# Patient Record
Sex: Female | Born: 1965
Health system: Southern US, Community
[De-identification: ages and names within clinical notes are randomized; demographics above are authoritative.]

## PROBLEM LIST (undated history)

## (undated) DIAGNOSIS — E079 Disorder of thyroid, unspecified: Secondary | ICD-10-CM

## (undated) DIAGNOSIS — T7840XA Allergy, unspecified, initial encounter: Secondary | ICD-10-CM

## (undated) DIAGNOSIS — I10 Essential (primary) hypertension: Secondary | ICD-10-CM

## (undated) HISTORY — DX: Essential (primary) hypertension: I10

## (undated) HISTORY — DX: Allergy, unspecified, initial encounter: T78.40XA

## (undated) HISTORY — PX: OTHER SURGICAL HISTORY: SHX169

---

## 1998-09-02 ENCOUNTER — Other Ambulatory Visit: Admission: RE | Admit: 1998-09-02 | Discharge: 1998-09-02 | Payer: Self-pay | Admitting: Obstetrics and Gynecology

## 1999-04-01 ENCOUNTER — Inpatient Hospital Stay (HOSPITAL_COMMUNITY): Admission: AD | Admit: 1999-04-01 | Discharge: 1999-04-01 | Payer: Self-pay | Admitting: Obstetrics and Gynecology

## 1999-09-11 ENCOUNTER — Other Ambulatory Visit: Admission: RE | Admit: 1999-09-11 | Discharge: 1999-09-11 | Payer: Self-pay | Admitting: Obstetrics and Gynecology

## 2000-05-13 ENCOUNTER — Other Ambulatory Visit: Admission: RE | Admit: 2000-05-13 | Discharge: 2000-05-13 | Payer: Self-pay | Admitting: Obstetrics and Gynecology

## 2000-08-05 ENCOUNTER — Encounter: Admission: RE | Admit: 2000-08-05 | Discharge: 2000-08-05 | Payer: Self-pay | Admitting: Family Medicine

## 2000-08-05 ENCOUNTER — Encounter: Payer: Self-pay | Admitting: Family Medicine

## 2001-09-09 ENCOUNTER — Other Ambulatory Visit: Admission: RE | Admit: 2001-09-09 | Discharge: 2001-09-09 | Payer: Self-pay | Admitting: Obstetrics and Gynecology

## 2002-01-15 ENCOUNTER — Emergency Department (HOSPITAL_COMMUNITY): Admission: EM | Admit: 2002-01-15 | Discharge: 2002-01-16 | Payer: Self-pay | Admitting: Emergency Medicine

## 2002-01-16 ENCOUNTER — Encounter: Payer: Self-pay | Admitting: Emergency Medicine

## 2002-09-10 ENCOUNTER — Other Ambulatory Visit: Admission: RE | Admit: 2002-09-10 | Discharge: 2002-09-10 | Payer: Self-pay | Admitting: Obstetrics and Gynecology

## 2003-02-16 ENCOUNTER — Encounter: Payer: Self-pay | Admitting: Obstetrics and Gynecology

## 2003-02-16 ENCOUNTER — Ambulatory Visit (HOSPITAL_COMMUNITY): Admission: RE | Admit: 2003-02-16 | Discharge: 2003-02-16 | Payer: Self-pay | Admitting: Obstetrics and Gynecology

## 2003-03-30 ENCOUNTER — Encounter: Admission: RE | Admit: 2003-03-30 | Discharge: 2003-03-30 | Payer: Self-pay | Admitting: Family Medicine

## 2003-09-10 ENCOUNTER — Other Ambulatory Visit: Admission: RE | Admit: 2003-09-10 | Discharge: 2003-09-10 | Payer: Self-pay | Admitting: Obstetrics and Gynecology

## 2004-07-27 ENCOUNTER — Encounter: Admission: RE | Admit: 2004-07-27 | Discharge: 2004-07-27 | Payer: Self-pay | Admitting: Family Medicine

## 2005-05-31 ENCOUNTER — Other Ambulatory Visit: Admission: RE | Admit: 2005-05-31 | Discharge: 2005-05-31 | Payer: Self-pay | Admitting: Obstetrics and Gynecology

## 2005-10-05 ENCOUNTER — Inpatient Hospital Stay (HOSPITAL_COMMUNITY): Admission: AD | Admit: 2005-10-05 | Discharge: 2005-10-05 | Payer: Self-pay | Admitting: Obstetrics and Gynecology

## 2007-01-17 ENCOUNTER — Emergency Department (HOSPITAL_COMMUNITY): Admission: EM | Admit: 2007-01-17 | Discharge: 2007-01-17 | Payer: Self-pay | Admitting: Emergency Medicine

## 2007-08-29 ENCOUNTER — Encounter: Admission: RE | Admit: 2007-08-29 | Discharge: 2007-08-29 | Payer: Self-pay | Admitting: Family Medicine

## 2007-09-02 ENCOUNTER — Encounter: Admission: RE | Admit: 2007-09-02 | Discharge: 2007-09-02 | Payer: Self-pay | Admitting: Family Medicine

## 2007-10-21 ENCOUNTER — Encounter: Admission: RE | Admit: 2007-10-21 | Discharge: 2007-10-21 | Payer: Self-pay | Admitting: Obstetrics and Gynecology

## 2007-11-11 ENCOUNTER — Encounter (INDEPENDENT_AMBULATORY_CARE_PROVIDER_SITE_OTHER): Payer: Self-pay | Admitting: Obstetrics and Gynecology

## 2007-11-11 ENCOUNTER — Ambulatory Visit (HOSPITAL_COMMUNITY): Admission: RE | Admit: 2007-11-11 | Discharge: 2007-11-11 | Payer: Self-pay | Admitting: Obstetrics and Gynecology

## 2009-05-05 ENCOUNTER — Other Ambulatory Visit: Admission: RE | Admit: 2009-05-05 | Discharge: 2009-05-05 | Payer: Self-pay | Admitting: Radiology

## 2010-06-17 ENCOUNTER — Encounter: Payer: Self-pay | Admitting: Family Medicine

## 2010-06-18 ENCOUNTER — Encounter: Payer: Self-pay | Admitting: Family Medicine

## 2010-09-21 ENCOUNTER — Ambulatory Visit (HOSPITAL_COMMUNITY)
Admission: RE | Admit: 2010-09-21 | Discharge: 2010-09-21 | Disposition: A | Discharge status: Home / Self Care | Payer: Federal, State, Local not specified - PPO | Source: Ambulatory Visit | Attending: Chiropractic Medicine | Admitting: Chiropractic Medicine

## 2010-09-21 ENCOUNTER — Other Ambulatory Visit (HOSPITAL_COMMUNITY): Payer: Self-pay | Admitting: Chiropractic Medicine

## 2010-09-21 DIAGNOSIS — R52 Pain, unspecified: Secondary | ICD-10-CM

## 2010-09-21 DIAGNOSIS — M545 Low back pain, unspecified: Secondary | ICD-10-CM | POA: Insufficient documentation

## 2010-09-21 DIAGNOSIS — M47817 Spondylosis without myelopathy or radiculopathy, lumbosacral region: Secondary | ICD-10-CM | POA: Insufficient documentation

## 2010-10-10 NOTE — Op Note (Signed)
NAMEYEN, Holly Hays NO.:  [id number]   MEDICAL RECORD NO.:  [id number]          PATIENT TYPE:  AMB   LOCATION:  SDC                           FACILITY:  [facility]   PHYSICIAN:  Osborn Coho, M.D.   DATE OF BIRTH:  1965-08-29   DATE OF PROCEDURE:  11/11/2007  DATE OF DISCHARGE:                               OPERATIVE REPORT   PREOPERATIVE DIAGNOSIS:  Missed abortion.   POSTOPERATIVE DIAGNOSIS:  Missed abortion.   PROCEDURE:  Dilation and evacuation.   ATTENDING PHYSICIAN:  Osborn Coho, MD   ANESTHESIA:  General, via LMA.   SPECIMEN TO PATHOLOGY:  Products of conception.   FLUIDS:  1000 mL.   URINE OUTPUT:  Quantity sufficient; via straight cath prior to  procedure.   ESTIMATED BLOOD LOSS:  Minimal.   COMPLICATIONS:  None.   PROCEDURE:  The patient was taken to the operating room after the risks,  benefits, and alternatives discussed with the patient.  The patient  verbalized understanding and consent signed and witnessed.  The patient  was placed under general per anesthesia and prepped and draped in a  normal sterile fashion in the dorsal lithotomy position.  A bivalve  speculum was placed in the patient's vagina and a paracervical block  administered using a total of 10 mL of 1% lidocaine.  The anterior lip  of the cervix was grasped with single-tooth tenaculum and the uterus  sounded to approximately 10 cm.  The cervix was dilated for passage of  the size #8 suction curette.  Suction curettage was performed and a  sharp curettage was performed until a gritty texture was noted.  Sharp  curettage was performed once again to remove any remaining debris.  The  tenaculum was removed and there was good hemostasis at the tenaculum  site.  The bivalve speculum was removed as well.  Count was correct.  The patient tolerated the procedure well and is currently awaiting  transfer to the recovery room in good condition.      Osborn Coho, M.D.  Electronically Signed     AR/MEDQ  D:  11/11/2007  T:  11/12/2007  Job:  [id number]

## 2010-10-10 NOTE — H&P (Signed)
NAMESLOKA, VOLANTE NO.:  [id number]   MEDICAL RECORD NO.:  [id number]          PATIENT TYPE:  AMB   LOCATION:  SDC                           [facility]   PHYSICIAN:  Osborn Coho, M.D.   DATE OF BIRTH:  1965-11-04   DATE OF ADMISSION:  DATE OF DISCHARGE:                              HISTORY & PHYSICAL   The patient is scheduled to have a D&E on November 11, 2007.  Holly Hays is a  45 year old gravida 4, para 3-0-0-3 who presents at 11 weeks and 2 days  by LMP, but 8 weeks by fetal size with an identified IUFD on ultrasound  on November 10, 2007.  The patient has been seen in the office for her new  OB visit on November 03, 2007, at that time she was 10 weeks and 1 day by  LMP.  The fetal heart tones could not be auscultated at that time, so  she was scheduled for an ultrasound.  In the intervening time, she  presented to [facility] yesterday for an episode of bleeding.  At that time, she per her report was told everything was okay; however,  based upon review with the records provided by the patient, there was an  8-week IUFD noted with a quantitative hCG level of 3352.  The patient  then followed up today for the scheduled ultrasound, and was found to  have an 8-week fetal demise.   Options reviewed with the patient, and the decision on her part was to  proceed with D&E.   History has been remarkable for:  1. Advanced maternal age.  2. Chronic hypertension with the patient is on labetalol 200 mg p.o.      b.i.d.  3. Hypothyroid.  The patient is not currently on any medications.  4. History of postpartum hemorrhage.  5. Obesity.   PRENATAL LABS:  Blood type is O positive.  Rh antibody negative.  VDRL  nonreactive.  Rubella titer is equivocal.  Hepatitis B surface antigen  negative. HIV was nonreactive.  Sickle cell test was negative.  Pap was  normal in May 2009.  GC and chlamydia cultures were negative on November 03, 2007.  Urinalysis and culture were  negative on November 03, 2007.  The  patient's TSH was within normal limits.  Comprehensive metabolic panel  was also within normal limits.   HISTORY OF PRESENT PREGNANCY:  As previously stated.   OBSTETRICAL HISTORY:  In 1985, the patient had a vaginal birth of a  female infant, weight 7 pounds 12 ounces at 40 weeks.  She was in labor  24 hours.  She had no anesthesia, that child was named [facility] and was  born in [place].  In 1987, she had a vaginal birth of a female  infant, weight 6 pounds 12 ounces at 40 weeks' gestation.  She was in  labor 24 hours.  She had no anesthesia, that child's name was [place],  and also born in [place].  In 1992, she had a vaginal birth of a  female infant, weight 8 pounds 12 ounces  at 40 weeks.  She was in labor  36 hours.  She had no anesthesia.  That child's name was [place], that  child was also born in [place].  The patient did have a postpartum  hemorrhage with that birth and did receive a blood transfusion  subsequently.   MEDICAL HISTORY:  She was a previous Depo-Provera, oral contraceptive,  NuvaRing, and IUD user.  She discontinued use of any contraceptions in  October 2008.  She reports usual childhood illnesses.  She has  occasional yeast infections.  She does have chronic hypertension and has  been on labetalol.  She reported the previously noted blood transfusion  with a postpartum hemorrhage after her third delivery.  She has  hypothyroidism, but is not on any current medication.  She has  occasional urinary tract infections and migraines.  Her only  hospitalization is for childbirth x3.  She has no surgical history.   GENETIC HISTORY:  Remarkable for the patient's age of 78.   FAMILY HISTORY:  Remarkable for multiple family members having  hypertension.  Her sisters have thyroid disease.   ALLERGIES:  The patient has no known medication allergies.  She does  have some reported history of sensitivity to latex.   SOCIAL HISTORY:   The patient is married with the father of the baby.  He  is involved and supportive.  His name is Tamirah George.  The patient is an  [place] of the [place] faith.  She did 12 years of college.  She is employed in a Clinical biochemist firm.  Her husband is graduate  educated, and he is employed with [employer].  She has also  been followed by Dr. Hal Hope at [facility].   PHYSICAL EXAMINATION:  VITAL SIGNS:  Stable.  The patient is febrile.  HEENT:  Within normal limits.  LUNGS:  Breath sounds are clear.  HEART:  Regular rate and rhythm without murmur.  BREASTS:  Soft and nontender.  ABDOMEN:  Fundal height is approximately 6-8 weeks' size and nontender.  PELVIC:  Deferred today.  EXTREMITIES:  Deep tendon reflexes are 2+ without clonus.  There is a  trace edema noted.   Her blood type is O positive.   IMPRESSION:  1. Fetal demise at 8 weeks.  2. O positive blood type.  3. Desires dilatation and evacuation.  4. Chronic hypertension, on labetalol.  5. Hypothyroidism, on no current medications.   PLAN:  1. Admit to [facility] for preoperative same-day surgery with Dr.      Su Hilt.  2. Routine preoperative orders.  3. Support the patient for her loss.      Holly Hays, C.N.M.      Osborn Coho, M.D.  Electronically Signed    VLL/MEDQ  D:  11/10/2007  T:  11/11/2007  Job:  [id number]

## 2011-01-01 ENCOUNTER — Other Ambulatory Visit (HOSPITAL_COMMUNITY)
Admission: RE | Admit: 2011-01-01 | Discharge: 2011-01-01 | Disposition: A | Discharge status: Home / Self Care | Payer: Federal, State, Local not specified - PPO | Source: Ambulatory Visit | Attending: Obstetrics and Gynecology | Admitting: Obstetrics and Gynecology

## 2011-01-01 ENCOUNTER — Other Ambulatory Visit: Payer: Self-pay | Admitting: Obstetrics and Gynecology

## 2011-01-01 DIAGNOSIS — Z124 Encounter for screening for malignant neoplasm of cervix: Secondary | ICD-10-CM | POA: Insufficient documentation

## 2011-02-22 LAB — CBC
Hemoglobin: 12.3
RBC: 3.63 — ABNORMAL LOW

## 2011-02-22 LAB — ABO/RH: ABO/RH(D): O POS

## 2011-02-22 LAB — TSH: TSH: 0.421

## 2011-02-22 LAB — T3: T3, Total: 140.4 (ref 80.0–204.0)

## 2011-03-09 LAB — I-STAT 8, (EC8 V) (CONVERTED LAB)
BUN: 9
Bicarbonate: 22.8
Chloride: 108
Glucose, Bld: 104 — ABNORMAL HIGH
HCT: 41
pCO2, Ven: 47.6
pH, Ven: 7.287

## 2011-03-09 LAB — POCT CARDIAC MARKERS
CKMB, poc: <1 — ABNORMAL LOW
Myoglobin, poc: 47
Operator id: 234501
Troponin i, poc: <0.05

## 2011-05-07 ENCOUNTER — Ambulatory Visit (INDEPENDENT_AMBULATORY_CARE_PROVIDER_SITE_OTHER): Payer: Federal, State, Local not specified - PPO | Admitting: Family Medicine

## 2011-05-07 DIAGNOSIS — I10 Essential (primary) hypertension: Secondary | ICD-10-CM

## 2011-06-11 ENCOUNTER — Ambulatory Visit: Payer: Federal, State, Local not specified - PPO | Admitting: Family Medicine

## 2011-08-25 ENCOUNTER — Other Ambulatory Visit: Payer: Self-pay | Admitting: Family Medicine

## 2011-09-13 DIAGNOSIS — Z8742 Personal history of other diseases of the female genital tract: Secondary | ICD-10-CM | POA: Insufficient documentation

## 2011-10-14 ENCOUNTER — Ambulatory Visit (INDEPENDENT_AMBULATORY_CARE_PROVIDER_SITE_OTHER): Payer: Federal, State, Local not specified - PPO | Admitting: Family Medicine

## 2011-10-14 VITALS — BP 147/91 | HR 71 | Temp 98.1°F | Resp 16 | Ht 67.5 in | Wt 219.6 lb

## 2011-10-14 DIAGNOSIS — Z111 Encounter for screening for respiratory tuberculosis: Secondary | ICD-10-CM

## 2011-10-14 DIAGNOSIS — I10 Essential (primary) hypertension: Secondary | ICD-10-CM

## 2011-10-14 DIAGNOSIS — Z0289 Encounter for other administrative examinations: Secondary | ICD-10-CM

## 2011-10-14 DIAGNOSIS — E039 Hypothyroidism, unspecified: Secondary | ICD-10-CM

## 2011-10-14 DIAGNOSIS — R3129 Other microscopic hematuria: Secondary | ICD-10-CM

## 2011-10-14 LAB — TSH: TSH: 0.617 u[IU]/mL (ref 0.350–4.500)

## 2011-10-14 LAB — POCT CBC
Hemoglobin: 12.7 g/dL (ref 12.2–16.2)
Lymph, poc: 2 (ref 0.6–3.4)
MCH, POC: 33 pg — AB (ref 27–31.2)
MCHC: 34.1 g/dL (ref 31.8–35.4)
MID (cbc): 0.5 (ref 0–0.9)
MPV: 9 fL (ref 0–99.8)
POC Granulocyte: 3.8 (ref 2–6.9)
POC LYMPH PERCENT: 32 %L (ref 10–50)
POC MID %: 7.8 %M (ref 0–12)
RDW, POC: 12.5 %
WBC: 6.3 10*3/uL (ref 4.6–10.2)

## 2011-10-14 LAB — COMPREHENSIVE METABOLIC PANEL
ALT: 11 U/L (ref 0–35)
Alkaline Phosphatase: 78 U/L (ref 39–117)
CO2: 23 mEq/L (ref 19–32)
Creat: 0.8 mg/dL (ref 0.50–1.10)
Total Bilirubin: 0.4 mg/dL (ref 0.3–1.2)

## 2011-10-14 LAB — LIPID PANEL
Cholesterol: 175 mg/dL (ref 0–200)
HDL: 50 mg/dL (ref >39)
Total CHOL/HDL Ratio: 3.5 Ratio
VLDL: 11 mg/dL (ref 0–40)

## 2011-10-14 LAB — POCT URINALYSIS DIPSTICK
Ketones, UA: NEGATIVE
Protein, UA: NEGATIVE
Spec Grav, UA: 1.025
pH, UA: 5

## 2011-10-14 MED ORDER — LEVOTHYROXINE SODIUM 100 MCG PO TABS: 100.0000 ug | ORAL_TABLET | Freq: Every day | ORAL | Status: DC

## 2011-10-14 MED ORDER — LISINOPRIL-HYDROCHLOROTHIAZIDE 20-12.5 MG PO TABS: 1.0000 | ORAL_TABLET | Freq: Every day | ORAL | Status: DC

## 2011-10-14 MED ORDER — AMLODIPINE BESYLATE 5 MG PO TABS: 5.0000 mg | ORAL_TABLET | Freq: Every day | ORAL | Status: DC

## 2011-10-14 NOTE — Progress Notes (Signed)
Patient Name: Holly Hays Date of Birth: 03-Jun-1965 Medical Record Number: [id number] Gender: female Date of Encounter: 10/14/2011  History of Present Illness:  Holly Hays is a 46 y.o. 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  She had a fever blister which she noted about a week ago.    She treated it with abreva and blistex.  However, the area is still sore and feels dry.  She sometimes uses valtrex but did not use this time.    She recetntly had a breast reduction so her mammogram is not done yet for this year- however she plans to do this soon.  Her pap is up to date per her OBG   She had a piece of bread this am but that is  LMP at the beginning of the month.    There is no problem list on file for this patient.  No past medical history on file. No past surgical history on file. History  Substance Use Topics  . Smoking status: Never Smoker   . Smokeless tobacco: Not on file  . Alcohol Use: No   No family history on file. Allergies  Allergen Reactions  . Latex Itching and Rash    Medication list has been reviewed and updated.  Review of Systems: As per HPI- otherwise negative.   Physical Examination: Filed Vitals:   10/14/11 0835 10/14/11 0841  BP: 155/94 147/91  Pulse: 60 71  Temp: 98.1 F (36.7 C) 98.1 F (36.7 C)  TempSrc: Oral Oral  Resp: 16 16  Height: 5' 7.5" (1.715 m) 5' 7.5" (1.715 m)  Weight: 219 lb 9.6 oz (99.61 kg) 219 lb 9.6 oz (99.61 kg)  SpO2: 100% 100%    Body mass index is 33.89 kg/(m^2).  GEN: WDWN, NAD, Non-toxic, A & O x 3, obese HEENT: Atraumatic, Normocephalic. Neck supple. No masses, No LAD.  Tm and oropharynx wnl.  Lips appear dry but other wise no lesions Ears and Nose: No external deformity. CV: RRR, No M/G/R. No JVD. No thrill. No extra heart sounds. PULM: CTA B, no wheezes, crackles, rhonchi. No retractions. No resp. distress. No  accessory muscle use. ABD: S, NT, ND, +BS. No rebound. No HSM. EXTR: No c/c/e NEURO Normal gait.  PSYCH: Normally interactive. Conversant. Not depressed or anxious appearing.  Calm demeanor.   Results for orders placed in visit on 10/14/11  POCT CBC      Component Value Range   WBC 6.3  4.6 - 10.2 (K/uL)   Lymph, poc 2.0  0.6 - 3.4    POC LYMPH PERCENT 32.0  10 - 50 (%L)   MID (cbc) 0.5  0 - 0.9    POC MID % 7.8  0 - 12 (%M)   POC Granulocyte 3.8  2 - 6.9    Granulocyte percent 60.2  37 - 80 (%G)   RBC 3.85 (*) 4.04 - 5.48 (M/uL)   Hemoglobin 12.7  12.2 - 16.2 (g/dL)   HCT, POC 46.9 (*) 62.9 - 47.9 (%)   MCV 96.5  80 - 97 (fL)   MCH, POC 33.0 (*) 27 - 31.2 (pg)   MCHC 34.1  31.8 - 35.4 (g/dL)   RDW, POC 52.8     Platelet Count, POC 288  142 - 424 (K/uL)   MPV 9.0  0 - 99.8 (fL)  POCT URINALYSIS DIPSTICK  Component Value Range   Color, UA yellow     Clarity, UA clear     Glucose, UA neg     Bilirubin, UA neg     Ketones, UA neg     Spec Grav, UA 1.025     Blood, UA small     pH, UA 5.0     Protein, UA neg     Urobilinogen, UA 0.2     Nitrite, UA neg     Leukocytes, UA Negative      Assessment and Plan: 1. Physical exam for certificate  POCT CBC, POCT urinalysis dipstick, TB Skin Test  2. Hypertension  Comprehensive metabolic panel, Lipid panel  3. Hypothyroidism  TSH   Labs pending as above, asked her to check her BP a few times over the next month and let me know the readings- may need to increase her norvasc to 10mg / day.  Otherwise will follow- up with her TSH, etc.  I think her lips are dry from excessive use of OTC medications/ medicated balms.  Use plain vaseline for protection only

## 2011-10-16 ENCOUNTER — Encounter: Payer: Self-pay | Admitting: Family Medicine

## 2011-10-16 NOTE — Progress Notes (Signed)
Addended by: Abbe Amsterdam C on: 10/16/2011 10:42 AM   Modules accepted: Orders

## 2011-10-17 ENCOUNTER — Ambulatory Visit (INDEPENDENT_AMBULATORY_CARE_PROVIDER_SITE_OTHER): Payer: Federal, State, Local not specified - PPO

## 2011-10-17 DIAGNOSIS — Z111 Encounter for screening for respiratory tuberculosis: Secondary | ICD-10-CM

## 2011-11-05 ENCOUNTER — Ambulatory Visit (INDEPENDENT_AMBULATORY_CARE_PROVIDER_SITE_OTHER): Payer: Federal, State, Local not specified - PPO | Admitting: Physician Assistant

## 2011-11-05 VITALS — BP 146/83 | HR 94 | Temp 98.1°F | Resp 18 | Ht 68.0 in | Wt 221.8 lb

## 2011-11-05 DIAGNOSIS — L282 Other prurigo: Secondary | ICD-10-CM

## 2011-11-05 DIAGNOSIS — L259 Unspecified contact dermatitis, unspecified cause: Secondary | ICD-10-CM

## 2011-11-05 MED ORDER — PREDNISONE 20 MG PO TABS: ORAL_TABLET | ORAL | Status: AC

## 2011-11-05 NOTE — Progress Notes (Signed)
  Subjective:    Patient ID: Holly Hays, female    DOB: 1966/05/02, 46 y.o.   MRN: [id number]  HPI Patient presents with pruritic rash on her neck and arms after being exposed to chemicals at work. States this exposure happened 1 week ago and she has been using benadryl and hydrocortisone cream without relief. Says it is extremely pruritic and she is worried about getting an infection from scratching. Denies fever, chills, nausea, vomiting, warmth, tenderness, or drainage.      Review of Systems Negative except for HPI.      Objective:   Physical Exam  Constitutional: She is oriented to person, place, and time. She appears well-developed and well-nourished.  HENT:  Head: Normocephalic and atraumatic.  Right Ear: External ear normal.  Left Ear: External ear normal.  Eyes: Conjunctivae are normal.  Neck: Normal range of motion.  Cardiovascular: Normal rate, regular rhythm and normal heart sounds.   Pulmonary/Chest: Effort normal and breath sounds normal.  Lymphadenopathy:    She has no cervical adenopathy.  Neurological: She is alert and oriented to person, place, and time.  Skin:     Psychiatric: She has a normal mood and affect. Her behavior is normal. Judgment and thought content normal.          Assessment & Plan:   1. Contact dermatitis  Prednisone 10 mg taper over 9 days.   2. Pruritic rash  Recommend OTC Zyrtec daily in addition to Benadryl qhs.  Follow up if symptoms persist

## 2011-11-05 NOTE — Patient Instructions (Signed)
Recommend OTC Zyrtec daily to help with itch Continue Benadryl 25-50 mg nightly  Contact Dermatitis Contact dermatitis is a reaction to certain substances that touch the skin. Contact dermatitis can be either irritant contact dermatitis or allergic contact dermatitis. Irritant contact dermatitis does not require previous exposure to the substance for a reaction to occur.Allergic contact dermatitis only occurs if you have been exposed to the substance before. Upon a repeat exposure, your body reacts to the substance.  CAUSES  Many substances can cause contact dermatitis. Irritant dermatitis is most commonly caused by repeated exposure to mildly irritating substances, such as:  Makeup.   Soaps.   Detergents.   Bleaches.   Acids.   Metal salts, such as nickel.  Allergic contact dermatitis is most commonly caused by exposure to:  Poisonous plants.   Chemicals (deodorants, shampoos).   Jewelry.   Latex.   Neomycin in triple antibiotic cream.   Preservatives in products, including clothing.  SYMPTOMS  The area of skin that is exposed may develop:  Dryness or flaking.   Redness.   Cracks.   Itching.   Pain or a burning sensation.   Blisters.  With allergic contact dermatitis, there may also be swelling in areas such as the eyelids, mouth, or genitals.  DIAGNOSIS  Your caregiver can usually tell what the problem is by doing a physical exam. In cases where the cause is uncertain and an allergic contact dermatitis is suspected, a patch skin test may be performed to help determine the cause of your dermatitis. TREATMENT Treatment includes protecting the skin from further contact with the irritating substance by avoiding that substance if possible. Barrier creams, powders, and gloves may be helpful. Your caregiver may also recommend:  Steroid creams or ointments applied 2 times daily. For best results, soak the rash area in cool water for 20 minutes. Then apply the medicine.  Cover the area with a plastic wrap. You can store the steroid cream in the refrigerator for a "chilly" effect on your rash. That may decrease itching. Oral steroid medicines may be needed in more severe cases.   Antibiotics or antibacterial ointments if a skin infection is present.   Antihistamine lotion or an antihistamine taken by mouth to ease itching.   Lubricants to keep moisture in your skin.   Burow's solution to reduce redness and soreness or to dry a weeping rash. Mix one packet or tablet of solution in 2 cups cool water. Dip a clean washcloth in the mixture, wring it out a bit, and put it on the affected area. Leave the cloth in place for 30 minutes. Do this as often as possible throughout the day.   Taking several cornstarch or baking soda baths daily if the area is too large to cover with a washcloth.  Harsh chemicals, such as alkalis or acids, can cause skin damage that is like a burn. You should flush your skin for 15 to 20 minutes with cold water after such an exposure. You should also seek immediate medical care after exposure. Bandages (dressings), antibiotics, and pain medicine may be needed for severely irritated skin.  HOME CARE INSTRUCTIONS  Avoid the substance that caused your reaction.   Keep the area of skin that is affected away from hot water, soap, sunlight, chemicals, acidic substances, or anything else that would irritate your skin.   Do not scratch the rash. Scratching may cause the rash to become infected.   You may take cool baths to help stop the itching.  Only take over-the-counter or prescription medicines as directed by your caregiver.   See your caregiver for follow-up care as directed to make sure your skin is healing properly.  SEEK MEDICAL CARE IF:   Your condition is not better after 3 days of treatment.   You seem to be getting worse.   You see signs of infection such as swelling, tenderness, redness, soreness, or warmth in the affected area.     You have any problems related to your medicines.  Document Released: 05/11/2000 Document Revised: 05/03/2011 Document Reviewed: 10/17/2010 [facility] Patient Information 2012 [place], [place].

## 2012-02-29 ENCOUNTER — Encounter: Payer: Self-pay | Admitting: Family Medicine

## 2012-06-25 ENCOUNTER — Ambulatory Visit (INDEPENDENT_AMBULATORY_CARE_PROVIDER_SITE_OTHER): Payer: Federal, State, Local not specified - PPO | Admitting: Family Medicine

## 2012-06-25 VITALS — BP 157/106 | HR 83 | Temp 97.9°F | Resp 16 | Ht 66.0 in | Wt 227.6 lb

## 2012-06-25 DIAGNOSIS — J3489 Other specified disorders of nose and nasal sinuses: Secondary | ICD-10-CM

## 2012-06-25 DIAGNOSIS — R05 Cough: Secondary | ICD-10-CM

## 2012-06-25 DIAGNOSIS — J019 Acute sinusitis, unspecified: Secondary | ICD-10-CM

## 2012-06-25 DIAGNOSIS — R059 Cough, unspecified: Secondary | ICD-10-CM

## 2012-06-25 MED ORDER — BENZONATATE 100 MG PO CAPS: 200.0000 mg | ORAL_CAPSULE | Freq: Two times a day (BID) | ORAL | Status: AC | PRN

## 2012-06-25 MED ORDER — AMOXICILLIN-POT CLAVULANATE 875-125 MG PO TABS: 1.0000 | ORAL_TABLET | Freq: Two times a day (BID) | ORAL | Status: DC

## 2012-06-25 MED ORDER — HYDROCODONE-HOMATROPINE 5-1.5 MG/5ML PO SYRP: 5.0000 mL | ORAL_SOLUTION | Freq: Every evening | ORAL | Status: DC | PRN

## 2012-06-25 MED ORDER — IPRATROPIUM BROMIDE 0.03 % NA SOLN: 2.0000 | Freq: Two times a day (BID) | NASAL | Status: DC

## 2012-06-25 NOTE — Progress Notes (Signed)
[facility]:  Office Visit  Chief Complaint:  Chief Complaint  Patient presents with  . Sinusitis    unable to sleep due to cough  . Headache    started Friday last week  . Nasal Congestion    yellow with blood  . Vomiting    4 times since Monday    HPI: Holly Hays is a 47 y.o. female who complains of 4 day history of Facial pain, sinus pain/congestion/pressure, fevers and chills. Has hard time breathing, no cough. No ear problem. Tried Vicks vapor rub, [employer] NS, and vitamin C and dayquil, nyquil. Can't keep anything in stomach. Sander Radon got sick first.   History reviewed. No pertinent past medical history. Past Surgical History  Procedure Date  . Breast reduction    History   Social History  . Marital Status: Married    Spouse Name: N/A    Number of Children: N/A  . Years of Education: N/A   Social History Main Topics  . Smoking status: Never Smoker   . Smokeless tobacco: None  . Alcohol Use: No  . Drug Use: No  . Sexually Active: Yes   Other Topics Concern  . None   Social History Narrative  . None   History reviewed. No pertinent family history. Allergies  Allergen Reactions  . Latex Itching and Rash   Prior to Admission medications   Medication Sig Start Date End Date Taking? Authorizing Provider  amLODipine (NORVASC) 5 MG tablet Take 1 tablet (5 mg total) by mouth daily. 10/14/11  Yes Gwenlyn Found Copland, MD  levothyroxine (SYNTHROID, LEVOTHROID) 100 MCG tablet Take 1 tablet (100 mcg total) by mouth daily. 10/14/11  Yes Gwenlyn Found Copland, MD  lisinopril-hydrochlorothiazide (PRINZIDE,ZESTORETIC) 20-12.5 MG per tablet Take 1 tablet by mouth daily. 10/14/11  Yes Gwenlyn Found Copland, MD     ROS: The patient denies night sweats, unintentional weight loss, chest pain, palpitations, wheezing, dyspnea on exertion, nausea, vomiting, abdominal pain, dysuria, hematuria, melena, numbness, weakness, or tingling.   All other systems have been  reviewed and were otherwise negative with the exception of those mentioned in the HPI and as above.    PHYSICAL EXAM: Filed Vitals:   06/25/12 1632  BP: 157/106  Pulse: 83  Temp: 97.9 F (36.6 C)  Resp: 16   Filed Vitals:   06/25/12 1632  Height: 5\' 6"  (1.676 m)  Weight: 227 lb 9.6 oz (103.239 kg)   Body mass index is 36.74 kg/(m^2).  General: Alert, tired appearing HEENT:  Normocephalic, atraumatic, oropharynx patent. + sinus tenderness, Tm nl, OP erythematous Cardiovascular:  Regular rate and rhythm, no rubs murmurs or gallops.  No Carotid bruits, radial pulse intact. No pedal edema.  Respiratory: Clear to auscultation bilaterally.  No wheezes, rales, or rhonchi.  No cyanosis, no use of accessory musculature GI: No organomegaly, abdomen is soft and non-tender, positive bowel sounds.  No masses. Skin: No rashes. Neurologic: Facial musculature symmetric. Psychiatric: Patient is appropriate throughout our interaction. Lymphatic: No cervical lymphadenopathy Musculoskeletal: Gait intact.   LABS:    EKG/XRAY:   Primary read interpreted by Dr. Conley Rolls at [facility].   ASSESSMENT/PLAN: Encounter Diagnoses  Name Primary?  . Acute sinusitis Yes  . Rhinorrhea   . Cough    Rx hydromet syrup, tessalon perles, augmentin, atrovent NS. Has bloody nose so will not try flonase If sxs of sinu pressure and pain not relieved by augmentin in 48 hrs then will rx steroids. F/u prn  Hamilton Capri PHUONG, DO 06/25/2012 5:47 PM

## 2012-08-05 ENCOUNTER — Ambulatory Visit (INDEPENDENT_AMBULATORY_CARE_PROVIDER_SITE_OTHER): Payer: Federal, State, Local not specified - PPO | Admitting: Family Medicine

## 2012-08-05 VITALS — BP 170/95 | HR 82 | Temp 98.1°F | Resp 16 | Ht 68.0 in | Wt 229.0 lb

## 2012-08-05 DIAGNOSIS — B009 Herpesviral infection, unspecified: Secondary | ICD-10-CM

## 2012-08-05 DIAGNOSIS — R21 Rash and other nonspecific skin eruption: Secondary | ICD-10-CM

## 2012-08-05 DIAGNOSIS — I10 Essential (primary) hypertension: Secondary | ICD-10-CM

## 2012-08-05 DIAGNOSIS — E039 Hypothyroidism, unspecified: Secondary | ICD-10-CM

## 2012-08-05 DIAGNOSIS — E049 Nontoxic goiter, unspecified: Secondary | ICD-10-CM

## 2012-08-05 LAB — POCT UA - MICROSCOPIC ONLY
Bacteria, U Microscopic: NEGATIVE
Casts, Ur, LPF, POC: NEGATIVE
Crystals, Ur, HPF, POC: NEGATIVE
Yeast, UA: NEGATIVE

## 2012-08-05 LAB — POCT URINALYSIS DIPSTICK
Bilirubin, UA: NEGATIVE
Glucose, UA: NEGATIVE
Ketones, UA: NEGATIVE
Leukocytes, UA: NEGATIVE
Nitrite, UA: NEGATIVE
Spec Grav, UA: >=1.03
Urobilinogen, UA: 0.2
pH, UA: 5.5

## 2012-08-05 MED ORDER — LISINOPRIL-HYDROCHLOROTHIAZIDE 20-12.5 MG PO TABS: 1.0000 | ORAL_TABLET | Freq: Every day | ORAL | Status: DC

## 2012-08-05 MED ORDER — AMLODIPINE BESYLATE 5 MG PO TABS: 5.0000 mg | ORAL_TABLET | Freq: Every day | ORAL | Status: DC

## 2012-08-05 MED ORDER — VALACYCLOVIR HCL 500 MG PO TABS: 500.0000 mg | ORAL_TABLET | Freq: Two times a day (BID) | ORAL | Status: DC

## 2012-08-05 MED ORDER — CLOBETASOL PROPIONATE 0.05 % EX CREA: TOPICAL_CREAM | Freq: Two times a day (BID) | CUTANEOUS | Status: DC

## 2012-08-05 MED ORDER — LEVOTHYROXINE SODIUM 100 MCG PO TABS: 100.0000 ug | ORAL_TABLET | Freq: Every day | ORAL | Status: DC

## 2012-08-05 NOTE — Progress Notes (Signed)
[facility]:  Office Visit  Chief Complaint:  Chief Complaint  Patient presents with  . Medication Refill    Blood pressure med, thyroid med and Valtrex  . Rash    X2days rash under L arm pitt, she thinks she was allergic to a sented soap.    HPI: Holly Hays is a 47 y.o. female who complains of: 1. HTN-At home when taking meds 170s/90s, has been out of meds x 1 week. Most of the time compliant. She feels tired during the day, she needs 5 min to take breather and rest, she is taking in less caffeinated coffee. She sleeps only 3-4 per night. She works third shift. She may snore at night. She denies apneic spells. Denies any Ha, vision changes, CP/SOB/leg edema/palpitations.  2. Hypothyroid-last dose 1 week ago. No sxs. Denies weight gain, depression, memory loss,+ thyroid nodule BL , last checked by [place] was in 2009 3. HSV-On ppx Valtrex 4. Rash-underneath left axilla after putting on new perfume, use Benadryl without relief.   Past Medical History  Diagnosis Date  . Hypertension    Past Surgical History  Procedure Laterality Date  . Breast reduction     History   Social History  . Marital Status: Married    Spouse Name: N/A    Number of Children: N/A  . Years of Education: N/A   Social History Main Topics  . Smoking status: Never Smoker   . Smokeless tobacco: None  . Alcohol Use: No  . Drug Use: No  . Sexually Active: Yes   Other Topics Concern  . None   Social History Narrative  . None   No family history on file. Allergies  Allergen Reactions  . Latex Itching and Rash   Prior to Admission medications   Medication Sig Start Date End Date Taking? Authorizing Lorilee Cafarella  amLODipine (NORVASC) 5 MG tablet Take 1 tablet (5 mg total) by mouth daily. 10/14/11  Yes Gwenlyn Found Copland, MD  lisinopril-hydrochlorothiazide (PRINZIDE,ZESTORETIC) 20-12.5 MG per tablet Take 1 tablet by mouth daily. 10/14/11  Yes Gwenlyn Found Copland, MD   amoxicillin-clavulanate (AUGMENTIN) 875-125 MG per tablet Take 1 tablet by mouth 2 (two) times daily. 06/25/12   Thao P Le, DO  HYDROcodone-homatropine (HYCODAN) 5-1.5 MG/5ML syrup Take 5 mLs by mouth at bedtime as needed for cough. 06/25/12   Thao P Le, DO  ipratropium (ATROVENT) 0.03 % nasal spray Place 2 sprays into the nose every 12 (twelve) hours. 06/25/12   Thao P Le, DO  levothyroxine (SYNTHROID, LEVOTHROID) 100 MCG tablet Take 1 tablet (100 mcg total) by mouth daily. 10/14/11   Gwenlyn Found Copland, MD     ROS: The patient denies fevers, chills, night sweats, unintentional weight loss, chest pain, palpitations, wheezing, dyspnea on exertion, nausea, vomiting, abdominal pain, dysuria, hematuria, melena, numbness, weakness, or tingling.   All other systems have been reviewed and were otherwise negative with the exception of those mentioned in the HPI and as above.    PHYSICAL EXAM: Filed Vitals:   08/05/12 1157  BP: 170/95  Pulse: 82  Temp: 98.1 F (36.7 C)  Resp: 16   Filed Vitals:   08/05/12 1157  Height: 5\' 8"  (1.727 m)  Weight: 229 lb (103.874 kg)   Body mass index is 34.83 kg/(m^2).  General: Alert, no acute distress HEENT:  Normocephalic, atraumatic, oropharynx patent. EOMI, + thyroid goiter rigth side.  Cardiovascular:  Regular rate and rhythm, no rubs murmurs or gallops.  No  Carotid bruits, radial pulse intact. No pedal edema.  Respiratory: Clear to auscultation bilaterally.  No wheezes, rales, or rhonchi.  No cyanosis, no use of accessory musculature GI: No organomegaly, abdomen is soft and non-tender, positive bowel sounds.  No masses. Skin: No rashes. Neurologic: Facial musculature symmetric. Psychiatric: Patient is appropriate throughout our interaction. Lymphatic: No cervical lymphadenopathy Musculoskeletal: Gait intact.   LABS: Results for orders placed in visit on 10/14/11  COMPREHENSIVE METABOLIC PANEL      Result Value Range   Sodium 137  135 - 145 mEq/L    Potassium 3.8  3.5 - 5.3 mEq/L   Chloride 104  96 - 112 mEq/L   CO2 23  19 - 32 mEq/L   Glucose, Bld 111 (*) 70 - 99 mg/dL   BUN 14  6 - 23 mg/dL   Creat 4.54  0.98 - 1.19 mg/dL   Total Bilirubin 0.4  0.3 - 1.2 mg/dL   Alkaline Phosphatase 78  39 - 117 U/L   AST 16  0 - 37 U/L   ALT 11  0 - 35 U/L   Total Protein 7.7  6.0 - 8.3 g/dL   Albumin 4.2  3.5 - 5.2 g/dL   Calcium 9.5  8.4 - 14.7 mg/dL  TSH      Result Value Range   TSH 0.617  0.350 - 4.500 uIU/mL  LIPID PANEL      Result Value Range   Cholesterol 175  0 - 200 mg/dL   Triglycerides 56  <829 mg/dL   HDL 50  >56 mg/dL   Total CHOL/HDL Ratio 3.5     VLDL 11  0 - 40 mg/dL   LDL Cholesterol 213 (*) 0 - 99 mg/dL  POCT CBC      Result Value Range   WBC 6.3  4.6 - 10.2 K/uL   Lymph, poc 2.0  0.6 - 3.4   POC LYMPH PERCENT 32.0  10 - 50 %L   MID (cbc) 0.5  0 - 0.9   POC MID % 7.8  0 - 12 %M   POC Granulocyte 3.8  2 - 6.9   Granulocyte percent 60.2  37 - 80 %G   RBC 3.85 (*) 4.04 - 5.48 M/uL   Hemoglobin 12.7  12.2 - 16.2 g/dL   HCT, POC 08.6 (*) 57.8 - 47.9 %   MCV 96.5  80 - 97 fL   MCH, POC 33.0 (*) 27 - 31.2 pg   MCHC 34.1  31.8 - 35.4 g/dL   RDW, POC 46.9     Platelet Count, POC 288  142 - 424 K/uL   MPV 9.0  0 - 99.8 fL  POCT URINALYSIS DIPSTICK      Result Value Range   Color, UA yellow     Clarity, UA clear     Glucose, UA neg     Bilirubin, UA neg     Ketones, UA neg     Spec Grav, UA 1.025     Blood, UA small     pH, UA 5.0     Protein, UA neg     Urobilinogen, UA 0.2     Nitrite, UA neg     Leukocytes, UA Negative    TB SKIN TEST      Result Value Range   TB Skin Test Negative     Induration 0       EKG/XRAY:   Primary read interpreted by Dr. Conley Rolls at [facility].   ASSESSMENT/PLAN:  Encounter Diagnoses  Name Primary?  Marland Kitchen Unspecified hypothyroidism Yes  . Accelerated hypertension   . Thyroid goiter    Refilled meds Labs pending Needs [place] of thyroid  Refer to sleep study for sxs of snoring,  HTN, diurnal tiredness Advise her to monitor BP, if she is getting sleep study then f/u in 6 months so this will give [place] a chance to see if CPAP helps with lowering BP if she does indeed have sleep apnea. If she is not getting sleep study and her BP continues to be greater than 140/90 on her meds then f/u in 1-3 months I think within in the next 3 months she will be able to get her thyroid US so we can discuss everything that time. She will be busy for the next 2 months for various reasons BP goals, lifestyle modifications given.  Risks of poorly controlled HTN given.  Go to ER if having CP/SOB    LE, THAO PHUONG, DO 08/05/2012 1:08 PM

## 2012-08-06 ENCOUNTER — Encounter: Payer: Self-pay | Admitting: Family Medicine

## 2012-08-06 DIAGNOSIS — E049 Nontoxic goiter, unspecified: Secondary | ICD-10-CM | POA: Insufficient documentation

## 2012-08-06 DIAGNOSIS — I10 Essential (primary) hypertension: Secondary | ICD-10-CM | POA: Insufficient documentation

## 2012-08-06 DIAGNOSIS — E039 Hypothyroidism, unspecified: Secondary | ICD-10-CM | POA: Insufficient documentation

## 2012-08-06 LAB — COMPREHENSIVE METABOLIC PANEL WITH GFR
AST: 17 U/L (ref 0–37)
Albumin: 4.5 g/dL (ref 3.5–5.2)
Alkaline Phosphatase: 69 U/L (ref 39–117)
Glucose, Bld: 101 mg/dL — ABNORMAL HIGH (ref 70–99)
Potassium: 3.8 meq/L (ref 3.5–5.3)
Sodium: 138 meq/L (ref 135–145)
Total Bilirubin: 0.5 mg/dL (ref 0.3–1.2)
Total Protein: 7.6 g/dL (ref 6.0–8.3)

## 2012-08-06 LAB — COMPREHENSIVE METABOLIC PANEL
ALT: 19 U/L (ref 0–35)
BUN: 9 mg/dL (ref 6–23)
CO2: 26 mEq/L (ref 19–32)
Calcium: 9.1 mg/dL (ref 8.4–10.5)
Chloride: 105 mEq/L (ref 96–112)
Creat: 0.84 mg/dL (ref 0.50–1.10)

## 2012-08-06 LAB — TSH: TSH: 1.04 u[IU]/mL (ref 0.350–4.500)

## 2012-08-06 LAB — MICROALBUMIN / CREATININE URINE RATIO
Creatinine, Urine: 295.4 mg/dL
Microalb Creat Ratio: 7.8 mg/g (ref 0.0–30.0)
Microalb, Ur: 2.3 mg/dL — ABNORMAL HIGH (ref 0.00–1.89)

## 2012-08-11 ENCOUNTER — Encounter: Payer: Self-pay | Admitting: Family Medicine

## 2012-08-25 ENCOUNTER — Ambulatory Visit
Admission: RE | Admit: 2012-08-25 | Discharge: 2012-08-25 | Disposition: A | Discharge status: Home / Self Care | Payer: Federal, State, Local not specified - PPO | Source: Ambulatory Visit | Attending: Family Medicine | Admitting: Family Medicine

## 2012-08-25 DIAGNOSIS — E049 Nontoxic goiter, unspecified: Secondary | ICD-10-CM

## 2012-09-04 ENCOUNTER — Encounter: Payer: Self-pay | Admitting: Family Medicine

## 2012-09-04 ENCOUNTER — Telehealth: Payer: Self-pay | Admitting: Family Medicine

## 2012-09-04 NOTE — Telephone Encounter (Signed)
LM about thyroid goiter being relatively unchanged, will continue to monitor.

## 2012-12-03 ENCOUNTER — Ambulatory Visit (INDEPENDENT_AMBULATORY_CARE_PROVIDER_SITE_OTHER): Payer: Federal, State, Local not specified - PPO | Admitting: Physician Assistant

## 2012-12-03 ENCOUNTER — Encounter: Payer: Self-pay | Admitting: Physician Assistant

## 2012-12-03 VITALS — BP 140/84 | HR 80 | Temp 98.1°F | Resp 16 | Ht 68.0 in | Wt 234.6 lb

## 2012-12-03 DIAGNOSIS — T783XXA Angioneurotic edema, initial encounter: Secondary | ICD-10-CM

## 2012-12-03 DIAGNOSIS — Z131 Encounter for screening for diabetes mellitus: Secondary | ICD-10-CM

## 2012-12-03 LAB — GLUCOSE, POCT (MANUAL RESULT ENTRY): POC Glucose: 93 mg/dl (ref 70–99)

## 2012-12-03 MED ORDER — PREDNISONE 20 MG PO TABS: ORAL_TABLET | ORAL | Status: DC
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
## 2012-12-03 NOTE — Patient Instructions (Addendum)
Continue ice and Benadryl  Start Zyrtec (cetirizine) daily in the morning May also take Zantac 150 mg daily Start prednisone taper - recommend to take all tabs together in the morning Follow up if symptoms worsen or fail to improve. To ER if acutely worsening or trouble breathing develops.    Angioedema Angioedema (AE) is a sudden swelling of the eyelids, lips, lobes of ears, external genitalia, skin, and other parts of the body. AE can happen by itself. It usually begins during the night and is found on awakening. It can happen with hives and other allergic reactions. Attacks can be mild and annoying, or life-threatening if the air passages swell. AE generally occurs in a short time period (over minutes to hours) and gets better in 24 to 48 hours. It usually does not cause any serious problems.  There are 2 different kinds of AE:   Allergic AE.  Nonallergic AE.  There may be an overreaction or direct stimulation of cells that are a part of the immune system (mast cells).  There may be problems with the release of chemicals made by the body that cause swelling and inflammation (kinins). AE due to kinins can be inherited from parents (hereditary), or it can develop on its own (acquired). Acquired AE either shows up before, or along with, certain diseases or is due to the body's immune system attacking parts of the body's own cells (autoimmune). CAUSES  Allergic  AE due to allergic reactions are caused by something that causes the body to react (trigger). Common triggers include:  Foods.  Medicines.  Latex.  Direct contact with certain fruits, vegetables, or animal saliva.  Insect stings. Nonallergic  Mast cell stimulation may be caused by:  Medicines.  Dyes used in X-rays.  The body's own immune system reactions to parts of the body (autoimmune disease).  Possibly, some virus infections.  AE due to problems with kinins can be hereditary or acquired. Attacks are triggered  by:  Mild injury.  Dental work or any surgery.  Stress.  Sudden changes in temperature.  Exercise.  Medicines.  AE due to problems with kinins can also be due to certain medicines, especially blood pressure medicines like angiotensin-converting enzyme (ACE) inhibitors. African Americans are at nearly 5 times greater risk of developing AE than Caucasians from ACE inhibitors. SYMPTOMS  Allergic symptoms:  Non-itchy swelling of the skin. Often the swelling is on the face and lips, but any area of the skin can swell. Sometimes, the swelling can be painful. If hives are present, there is intense itching.  Breathing problems if the air passages swell. Nonallergic symptoms:  If internal organs are involved, there may be:  Nausea.  Abdominal pain.  Vomiting.  Difficulty swallowing.  Difficulty passing urine.  Breathing problems if the air passages swell. Depending on the cause of AE, episodes may:  Only happen once (if triggers are removed or avoided).  Come back in unpredictable patterns.  Repeat for several years and then gradually fade away. DIAGNOSIS  AE is diagnosed by:   Asking questions to find out how fast the symptoms began.  Taking a family history.  Physical exam.  Diagnostic tests. Tests could include:  Allergy skin tests to see if the problem is allergic.  Blood tests to diagnose hereditary and some acquired types of AE.  Other tests to see if there is a hidden disease leading to the AE. TREATMENT  Treatment depends on the type and cause (if any) of the AE. Allergic  Allergic  types of AE are treated with:  Immediate removal of the trigger or medicine (if any).  Epinephrine injection.  Steroids.  Antihistamines.  Hospitalization for severe attacks. Nonallergic  Mast cell stimulation types of AE are treated with:  Immediate removal of the trigger or medicine (if any).  Epinephrine  injection.  Steroids.  Antihistamines.  Hospitalization for severe attacks.  Hereditary AE is treated with:  Medicines to prevent and treat attacks. There is little response to antihistamines, epinephrine, or steroids.  Preventive medicines before dental work or surgery.  Removing or avoiding medicines that trigger attacks.  Hospitalization for severe attacks.  Acquired AE is treated with:  Treating underlying disease (if any).  Medicines to prevent and treat attacks. HOME CARE INSTRUCTIONS   Always carry your emergency allergy treatment medicines with you.  Wear a medical bracelet.  Avoid known triggers. SEEK MEDICAL CARE IF:   You get repeat attacks.  Your attacks are more frequent or more severe despite preventive measures.  You have hereditary AE and are considering having children. It is important to discuss the risks of passing this on to your children. SEEK IMMEDIATE MEDICAL CARE IF:   You have difficulty breathing.  You have difficulty swallowing.  You experience fainting. This condition should be treated immediately. It can be life-threatening if it involves throat swelling. Document Released: 07/23/2001 Document Revised: 08/06/2011 Document Reviewed: 05/13/2008 [facility] Patient Information 2014 [place], [place].

## 2013-03-29 ENCOUNTER — Ambulatory Visit (INDEPENDENT_AMBULATORY_CARE_PROVIDER_SITE_OTHER): Payer: Federal, State, Local not specified - PPO | Admitting: Family Medicine

## 2013-03-29 VITALS — BP 184/98 | HR 92 | Temp 98.4°F | Resp 20 | Ht 67.75 in | Wt 230.2 lb

## 2013-03-29 DIAGNOSIS — S0990XA Unspecified injury of head, initial encounter: Secondary | ICD-10-CM

## 2013-03-29 DIAGNOSIS — I10 Essential (primary) hypertension: Secondary | ICD-10-CM

## 2013-03-29 DIAGNOSIS — B009 Herpesviral infection, unspecified: Secondary | ICD-10-CM

## 2013-03-29 DIAGNOSIS — E039 Hypothyroidism, unspecified: Secondary | ICD-10-CM

## 2013-03-29 MED ORDER — VALACYCLOVIR HCL 500 MG PO TABS: 500.0000 mg | ORAL_TABLET | Freq: Two times a day (BID) | ORAL | Status: DC

## 2013-03-29 MED ORDER — LEVOTHYROXINE SODIUM 100 MCG PO TABS: 100.0000 ug | ORAL_TABLET | Freq: Every day | ORAL | Status: DC

## 2013-03-29 MED ORDER — METOPROLOL SUCCINATE ER 200 MG PO TB24: 200.0000 mg | ORAL_TABLET | Freq: Every day | ORAL | Status: DC

## 2013-03-29 MED ORDER — LISINOPRIL-HYDROCHLOROTHIAZIDE 20-12.5 MG PO TABS: 1.0000 | ORAL_TABLET | Freq: Every day | ORAL | Status: DC
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
## 2013-05-08 ENCOUNTER — Encounter (HOSPITAL_COMMUNITY): Payer: Self-pay | Admitting: Emergency Medicine

## 2013-05-08 ENCOUNTER — Emergency Department (HOSPITAL_COMMUNITY): Payer: Federal, State, Local not specified - PPO

## 2013-05-08 ENCOUNTER — Emergency Department (HOSPITAL_COMMUNITY)
Admission: EM | Admit: 2013-05-08 | Discharge: 2013-05-08 | Disposition: A | Discharge status: Home / Self Care | Payer: Federal, State, Local not specified - PPO | Attending: Emergency Medicine | Admitting: Emergency Medicine

## 2013-05-08 DIAGNOSIS — I10 Essential (primary) hypertension: Secondary | ICD-10-CM | POA: Insufficient documentation

## 2013-05-08 DIAGNOSIS — E079 Disorder of thyroid, unspecified: Secondary | ICD-10-CM | POA: Insufficient documentation

## 2013-05-08 DIAGNOSIS — E876 Hypokalemia: Secondary | ICD-10-CM | POA: Insufficient documentation

## 2013-05-08 DIAGNOSIS — J029 Acute pharyngitis, unspecified: Secondary | ICD-10-CM | POA: Insufficient documentation

## 2013-05-08 DIAGNOSIS — R079 Chest pain, unspecified: Secondary | ICD-10-CM

## 2013-05-08 DIAGNOSIS — Z79899 Other long term (current) drug therapy: Secondary | ICD-10-CM | POA: Insufficient documentation

## 2013-05-08 DIAGNOSIS — Z91040 Latex allergy status: Secondary | ICD-10-CM | POA: Insufficient documentation

## 2013-05-08 HISTORY — DX: Disorder of thyroid, unspecified: E07.9

## 2013-05-08 LAB — POCT I-STAT, CHEM 8
BUN: 7 mg/dL (ref 6–23)
Chloride: 102 mEq/L (ref 96–112)
HCT: 43 % (ref 36.0–46.0)
Hemoglobin: 14.6 g/dL (ref 12.0–15.0)
Potassium: 3.2 mEq/L — ABNORMAL LOW (ref 3.5–5.1)
Sodium: 140 mEq/L (ref 135–145)

## 2013-05-08 LAB — CBC
HCT: 38.2 % (ref 36.0–46.0)
Hemoglobin: 13.2 g/dL (ref 12.0–15.0)
MCHC: 34.6 g/dL (ref 30.0–36.0)
RDW: 11.8 % (ref 11.5–15.5)
WBC: 6.7 10*3/uL (ref 4.0–10.5)

## 2013-05-08 LAB — POCT I-STAT TROPONIN I

## 2013-05-08 MED ORDER — GI COCKTAIL ~~LOC~~: 30.0000 mL | Freq: Once | ORAL | Status: DC

## 2013-05-08 MED ORDER — GI COCKTAIL ~~LOC~~
30.0000 mL | Freq: Once | ORAL | Status: AC
  Administered 2013-05-08: 30 mL via ORAL
  Filled 2013-05-08: qty 30

## 2013-05-08 MED ORDER — PANTOPRAZOLE SODIUM 40 MG PO TBEC
40.0000 mg | DELAYED_RELEASE_TABLET | Freq: Once | ORAL | Status: AC
  Administered 2013-05-08: 40 mg via ORAL
  Filled 2013-05-08: qty 1

## 2013-05-08 MED ORDER — NITROGLYCERIN 0.4 MG SL SUBL
0.4000 mg | SUBLINGUAL_TABLET | SUBLINGUAL | Status: DC | PRN
  Filled 2013-05-08: qty 25

## 2013-05-08 MED ORDER — ASPIRIN 81 MG PO CHEW
324.0000 mg | CHEWABLE_TABLET | Freq: Once | ORAL | Status: DC
  Filled 2013-05-08: qty 4

## 2013-05-08 MED ORDER — FAMOTIDINE 20 MG PO TABS
20.0000 mg | ORAL_TABLET | Freq: Once | ORAL | Status: AC
  Administered 2013-05-08: 20 mg via ORAL
  Filled 2013-05-08: qty 1

## 2013-05-08 MED ORDER — POTASSIUM CHLORIDE CRYS ER 20 MEQ PO TBCR
20.0000 meq | EXTENDED_RELEASE_TABLET | Freq: Once | ORAL | Status: AC
  Administered 2013-05-08: 20 meq via ORAL
  Filled 2013-05-08: qty 1

## 2013-05-08 MED ORDER — FAMOTIDINE 20 MG PO TABS: 20.0000 mg | ORAL_TABLET | Freq: Two times a day (BID) | ORAL | Status: DC

## 2013-05-08 NOTE — ED Notes (Signed)
Patient with c/o CP, lightheadedness, "feeling shaky" after having BP meds changed again several days ago PIV placed  Portable CXR at bedside

## 2013-05-08 NOTE — ED Notes (Signed)
Patient reports taking TUMS PTA r/t she believed symptoms due to "acid reflux." Patient remains on cardiac monitor, talking on cell phone--appears in NAD Patient medicated, see MAR

## 2013-05-08 NOTE — ED Notes (Signed)
Pt states that she woke up and felt dizzy and a tightness in her chest. She states that she couldn't breathe. Pt is very unsteady in triage and shaky

## 2013-05-08 NOTE — ED Notes (Signed)
MD at bedside. 

## 2013-05-08 NOTE — ED Notes (Signed)
MD at bedside Per MD, GI cocktail to be given prior to ASA Order placed

## 2013-05-08 NOTE — ED Provider Notes (Signed)
CSN: [id number]     Arrival date & time 05/08/13  0454 History   First MD Initiated Contact with Patient 05/08/13 [phone]     Chief Complaint  Patient presents with  . Chest Pain   (Consider location/radiation/quality/duration/timing/severity/associated sxs/prior Treatment) HPI History provided by patient. Went to work around 3 AM today and developed burning substernal and she also feels this in her throat. No radiation of pain. No associated shortness of breath. No nausea vomiting. No diaphoresis. No history of same. 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  She drinks coffee daily and had a salad last night for better. She's not a smoker and denies any alcohol use. Symptoms moderate in severity. No known aggravating or alleviating factors. She took it comes at home without relief. No history of reflux Past Medical History  Diagnosis Date  . Hypertension   . Thyroid disease    Past Surgical History  Procedure Laterality Date  . Breast reduction     History reviewed. No pertinent family history. History  Substance Use Topics  . Smoking status: Never Smoker   . Smokeless tobacco: Not on file  . Alcohol Use: No   OB History   Grav Para Term Preterm Abortions TAB SAB Ect Mult Living                 Review of Systems  Constitutional: Negative for fever and chills.  HENT: Positive for sore throat.   Eyes: Negative for pain.  Respiratory: Negative for shortness of breath.   Cardiovascular: Positive for chest pain.  Gastrointestinal: Negative for abdominal pain.  Genitourinary: Negative for dysuria.  Musculoskeletal: Negative for back pain, neck pain and neck stiffness.  Skin: Negative for rash.  Neurological: Negative for headaches.  All other systems reviewed and are negative.    Allergies  Latex  Home Medications   Current Outpatient Rx  Name  Route  Sig  Dispense  Refill  . clobetasol cream (TEMOVATE) 0.05 %   Topical   Apply topically 2 (two)  times daily.   60 g   0   . levothyroxine (SYNTHROID, LEVOTHROID) 100 MCG tablet   Oral   Take 1 tablet (100 mcg total) by mouth daily.   90 tablet   1   . lisinopril-hydrochlorothiazide (PRINZIDE,ZESTORETIC) 20-12.5 MG per tablet   Oral   Take 1 tablet by mouth daily.   90 tablet   1   . metoprolol (TOPROL XL) 200 MG 24 hr tablet   Oral   Take 1 tablet (200 mg total) by mouth daily.   30 tablet   5   . valACYclovir (VALTREX) 500 MG tablet   Oral   Take 1 tablet (500 mg total) by mouth 2 (two) times daily.   9 tablet   1    BP 202/92  Pulse 67  Temp(Src) 98.3 F (36.8 C) (Oral)  Resp 20  SpO2 95%  LMP 05/08/2013 Physical Exam  Constitutional: She is oriented to person, place, and time. She appears well-developed and well-nourished.  HENT:  Head: Normocephalic and atraumatic.  Eyes: Conjunctivae and EOM are normal. Pupils are equal, round, and reactive to light.  Neck: Trachea normal. Neck supple. No thyromegaly present.  Cardiovascular: Normal rate, regular rhythm, S1 normal, S2 normal and normal pulses.     No systolic murmur is present   No diastolic murmur is present  Pulses:      Radial pulses are 2+ on the right side, and 2+  on the left side.  Pulmonary/Chest: Effort normal and breath sounds normal. She has no wheezes. She has no rhonchi. She has no rales. She exhibits no tenderness.  Abdominal: Soft. Normal appearance and bowel sounds are normal. There is no tenderness. There is no CVA tenderness and negative Murphy's sign.  Musculoskeletal:  calves nontender, no cords or erythema, negative Homans sign  Neurological: She is alert and oriented to person, place, and time. She has normal strength. No cranial nerve deficit or sensory deficit. GCS eye subscore is 4. GCS verbal subscore is 5. GCS motor subscore is 6.  Skin: Skin is warm and dry. No rash noted. She is not diaphoretic.  Psychiatric: Her speech is normal.  Cooperative and appropriate    ED  Course  Procedures (including critical care time) Labs Review Labs Reviewed  POCT I-STAT, CHEM 8 - Abnormal; Notable for the following:    Potassium 3.2 (*)    Glucose, Bld 115 (*)    All other components within normal limits  CBC  POCT I-STAT TROPONIN I   Imaging Review Dg Chest Portable 1 View  05/08/2013   CLINICAL DATA:  Chest pain and burning.  EXAM: PORTABLE CHEST - 1 VIEW  COMPARISON:  Chest radiograph performed 01/17/2007  FINDINGS: The lungs are well-aerated and clear. There is no evidence of focal opacification, pleural effusion or pneumothorax.  The cardiomediastinal silhouette is within normal limits. No acute osseous abnormalities are seen.  IMPRESSION: No acute cardiopulmonary process seen.   Electronically Signed   By: Roanna Raider M.D.   On: 05/08/2013 05:34     Date: 05/08/2013  Rate: 63  Rhythm: normal sinus rhythm  QRS Axis: normal  Intervals: normal  ST/T Wave abnormalities: nonspecific ST changes  Conduction Disutrbances:none  Narrative Interpretation:   Old EKG Reviewed: unchanged  GI cocktail provided and pain resolved.  Pepcid Protonix given.  6:47 AM remains pain-free and is requesting to be discharged. Patient is followed by Dr. Cliffton Asters she agrees to call her for close outpatient followup. She'll take Pepcid as prescribed. She agrees to strict return precautions for any return of chest pain. She verbalizes understanding all discharge and followup instructions.  MDM   Dx: Chest pain, hypokalemia  Chest x-ray, EKG and labs obtained and reviewed as above. Symptoms improved with GI cocktail. Low risk for ACS - I discussed this with patient. Her only risk factor is hypertension.  Vital signs & nursing notes reviewed and considered   Sunnie Nielsen, MD 05/08/13 [phone]

## 2013-05-08 NOTE — ED Notes (Signed)
Patient reports that she is feeling better and would like to go home MD aware

## 2013-05-08 NOTE — ED Notes (Signed)
Patient reports relief after GI cocktail, see MAR Patient given additional medications Will inform MD of above Patient in NAD

## 2013-05-31 ENCOUNTER — Ambulatory Visit (INDEPENDENT_AMBULATORY_CARE_PROVIDER_SITE_OTHER): Payer: Federal, State, Local not specified - PPO | Admitting: Physician Assistant

## 2013-05-31 VITALS — BP 168/118 | HR 86 | Temp 98.0°F | Resp 16 | Ht 68.0 in | Wt 232.0 lb

## 2013-05-31 DIAGNOSIS — L282 Other prurigo: Secondary | ICD-10-CM

## 2013-05-31 NOTE — Progress Notes (Signed)
   Subjective:    Patient ID: Holly Hays, female    DOB: 1965-08-29, 48 y.o.   MRN: [id number]  HPI 48 year old female presents for evaluation of 3-4 day history of pruritic rash of left groin.  States symptoms started after wearing thong underwear 4 days ago. She does not normally wear thong underwear, but her daughters bought it for her to try out.  Since then she has had a pruritic, irritated area on left groin area. No blisters, pain, drainage, warmth, nausea, vomiting, vaginal discharge, fever or chills. No hx of similar problems. She is allergic to latex. Admits to having sensitive skin. She has taken 1 dose of benadryl that has helped some.  Patient is otherwise doing well with no other concerns today.     Review of Systems  Constitutional: Negative for fever and chills.  Gastrointestinal: Negative for nausea and vomiting.  Skin: Positive for rash.  Neurological: Negative for dizziness and headaches.       Objective:   Physical Exam  Constitutional: She is oriented to person, place, and time. She appears well-developed and well-nourished.  HENT:  Head: Normocephalic and atraumatic.  Right Ear: External ear normal.  Left Ear: External ear normal.  Eyes: Conjunctivae are normal.  Neck: Normal range of motion.  Cardiovascular: Normal rate.   Pulmonary/Chest: Effort normal.  Genitourinary:     Lymphadenopathy:    She has no cervical adenopathy.  Neurological: She is alert and oriented to person, place, and time.  Psychiatric: She has a normal mood and affect. Her behavior is normal. Judgment and thought content normal.          Assessment & Plan:   Pruritic rash  Likely hypersensitivity reaction. No obvious skin changes noted today Continue Benadryl at bedtime. Start Zyrtec daily in the morning Monitor for changes Follow up if symptoms worsen or fail to improve.

## 2013-05-31 NOTE — Patient Instructions (Signed)
Start Benadryl 25-50 mg at bedtime Zyrtec (cetirizine) daily in the morning

## 2013-07-05 ENCOUNTER — Ambulatory Visit (INDEPENDENT_AMBULATORY_CARE_PROVIDER_SITE_OTHER): Payer: Federal, State, Local not specified - PPO | Admitting: Family Medicine

## 2013-07-05 VITALS — BP 154/96 | HR 97 | Temp 98.3°F | Ht 68.5 in | Wt 230.0 lb

## 2013-07-05 DIAGNOSIS — L732 Hidradenitis suppurativa: Secondary | ICD-10-CM

## 2013-07-05 DIAGNOSIS — I10 Essential (primary) hypertension: Secondary | ICD-10-CM

## 2013-07-05 DIAGNOSIS — E039 Hypothyroidism, unspecified: Secondary | ICD-10-CM

## 2013-07-05 DIAGNOSIS — K219 Gastro-esophageal reflux disease without esophagitis: Secondary | ICD-10-CM

## 2013-07-05 DIAGNOSIS — B009 Herpesviral infection, unspecified: Secondary | ICD-10-CM

## 2013-07-05 LAB — COMPREHENSIVE METABOLIC PANEL
ALBUMIN: 4 g/dL (ref 3.5–5.2)
ALT: 18 U/L (ref 0–35)
AST: 18 U/L (ref 0–37)
Alkaline Phosphatase: 77 U/L (ref 39–117)
BUN: 9 mg/dL (ref 6–23)
CHLORIDE: 104 meq/L (ref 96–112)
CO2: 23 mEq/L (ref 19–32)
Calcium: 9.3 mg/dL (ref 8.4–10.5)
Creat: 0.86 mg/dL (ref 0.50–1.10)
GLUCOSE: 136 mg/dL — AB (ref 70–99)
POTASSIUM: 3.7 meq/L (ref 3.5–5.3)
SODIUM: 137 meq/L (ref 135–145)
TOTAL PROTEIN: 7.1 g/dL (ref 6.0–8.3)
Total Bilirubin: 0.6 mg/dL (ref 0.2–1.2)

## 2013-07-05 LAB — TSH: TSH: 0.431 u[IU]/mL (ref 0.350–4.500)

## 2013-07-05 MED ORDER — VALACYCLOVIR HCL 500 MG PO TABS: 500.0000 mg | ORAL_TABLET | Freq: Every day | ORAL | Status: DC

## 2013-07-05 MED ORDER — DOXYCYCLINE HYCLATE 100 MG PO TABS: 100.0000 mg | ORAL_TABLET | Freq: Two times a day (BID) | ORAL | Status: DC

## 2013-07-05 MED ORDER — LISINOPRIL-HYDROCHLOROTHIAZIDE 20-12.5 MG PO TABS: 1.0000 | ORAL_TABLET | Freq: Every day | ORAL | Status: DC

## 2013-07-05 MED ORDER — LEVOTHYROXINE SODIUM 100 MCG PO TABS: 100.0000 ug | ORAL_TABLET | Freq: Every day | ORAL | Status: DC

## 2013-07-05 MED ORDER — FAMOTIDINE 20 MG PO TABS: 20.0000 mg | ORAL_TABLET | Freq: Two times a day (BID) | ORAL | Status: DC

## 2013-07-05 MED ORDER — METOPROLOL SUCCINATE ER 200 MG PO TB24: 200.0000 mg | ORAL_TABLET | Freq: Every day | ORAL | Status: DC

## 2013-07-05 NOTE — Patient Instructions (Addendum)
Keep a record of your blood pressures outside of the office and bring them to the next office visit in the next few weeks along with your meter. You should receive a call or letter about your lab results within the next week to 10 days.  Fasting for 8 hours if possible for next office visit - will call you to schedule this.   Warm compresses to R axilla next few days - return if any increase in swelling or worsening. Can start antibiotic tomorrow if not improving.   Hidradenitis Suppurativa, Sweat Gland Abscess Hidradenitis suppurativa is a long lasting (chronic), uncommon disease of the sweat glands. With this, boil-like lumps and scarring develop in the groin, some times under the arms (axillae), and under the breasts. It may also uncommonly occur behind the ears, in the crease of the buttocks, and around the genitals.  CAUSES  The cause is from a blocking of the sweat glands. They then become infected. It may cause drainage and odor. It is not contagious. So it cannot be given to someone else. It most often shows up in puberty (about 2210 to 46 years of age). But it may happen much later. It is similar to acne which is a disease of the sweat glands. This condition is slightly more common in African-Americans and women. SYMPTOMS   Hidradenitis usually starts as one or more red, tender, swellings in the groin or under the arms (axilla).  Over a period of hours to days the lesions get larger. They often open to the skin surface, draining clear to yellow-colored fluid.  The infected area heals with scarring. DIAGNOSIS  Your caregiver makes this diagnosis by looking at you. Sometimes cultures (growing germs on plates in the lab) may be taken. This is to see what germ (bacterium) is causing the infection.  TREATMENT   Topical germ killing medicine applied to the skin (antibiotics) are the treatment of choice. Antibiotics taken by mouth (systemic) are sometimes needed when the condition is getting  worse or is severe.  Avoid tight-fitting clothing which traps moisture in.  Dirt does not cause hidradenitis and it is not caused by poor hygiene.  Involved areas should be cleaned daily using an antibacterial soap. Some patients find that the liquid form of Lever 2000, applied to the involved areas as a lotion after bathing, can help reduce the odor related to this condition.  Sometimes surgery is needed to drain infected areas or remove scarred tissue. Removal of large amounts of tissue is used only in severe cases.  Birth control pills may be helpful.  Oral retinoids (vitamin A derivatives) for 6 to 12 months which are effective for acne may also help this condition.  Weight loss will improve but not cure hidradenitis. It is made worse by being overweight. But the condition is not caused by being overweight.  This condition is more common in people who have had acne.  It may become worse under stress. There is no medical cure for hidradenitis. It can be controlled, but not cured. The condition usually continues for years with periods of getting worse and getting better (remission). Document Released: 12/27/2003 Document Revised: 08/06/2011 Document Reviewed: 01/12/2008 [facility] Patient Information 2014 [place], [place].

## 2013-07-05 NOTE — Progress Notes (Addendum)
Subjective:  This chart was scribed for [name], MD by Carl Best, Medical Scribe. This patient was seen in Room 5 and the patient's care was started at 11:35 AM.  Authored by Silas Sacramento, MD - unable to change in [facility].   Patient ID: Holly Hays, female    DOB: 1965/06/05, 48 y.o.   MRN: [id number]  HPI HPI Comments: Holly Hays is a 48 y.o. female who presents to the [facility] complaining of of multiple concerns as above including a mass on her left arm and refill of medications.  The patient's PCP is listed as Dr. Perrin Maltese.   The patient states that she has a bump under her right axillary that she noticed a week ago.  She states that the bump has increased in size over the week.  The patient states that she was unable to lift her granddaughter up a couple of days ago.  The patient states that she has not changed any of her lotion, deodorant, or soaps.  She denies fever and chills as associated symptoms.  She states that she applied heat to the area with no relief in her symptoms.  She states that she tried popping it but was unable to drain the area.    The patient states that she needs a refill on all of her current medications listed below:   Hypertension - Lisinopril-HCTZ 10-12.5mg  and Toprol 200mg  .  The patient states that she ran out of her blood pressure medication two days ago.  The patient was seen in the ED on May 08, 2013 for substernal chest pain.  The patient had an EKG that revealed nonspecific ST changes but no acute findings.  Pepcid, Protonix, and GI cocktail were administered and the patient's symptoms improved.  The patient's husband states that the patient was diagnosed with GERD and was instructed to follow-up with her PCP.  The patient did not address this issue at her last visit at [facility].  The patient states that she has been checking her blood pressure regularly at home and is normally 180/90 while on medication.  She states that she  will place the blood pressure cuff on her wrist.  The patient denies lightheadedness, SOB, chest pain, slurred speech, trouble moving her arms or legs, and headaches as associated symptoms.    Hypothyroidism - Synthroid .  She states that she has been out of her hyperthyroid medication for the past two days.  The patient takes 100 micrograms once a day.  She denies any weight changes, hair or skin changes, or fluctuations in temperature lately.  She states that her last TSH was 1.04 on August 05, 2012.    Genital Herpes - Valtrex 500mg  once a day.  The patient states that she has not had an outbreak in the past year.   Reflux - Pepcid 20mg .  Has not taken it for the past two days.  She states that she has taken it twice a day daily since being discharged from the ED.    The patient states that she has a history of irregular menstrual cycles with her last cycle occurring on May 28, 2013.  Patient Active Problem List   Diagnosis Date Noted   Hypertension 08/06/2012   Hypothyroidism 08/06/2012   Thyroid goiter 08/06/2012   Past Medical History  Diagnosis Date   Hypertension    Thyroid disease    Past Surgical History  Procedure Laterality Date   Breast reduction  Allergies  Allergen Reactions   Latex Itching and Rash   Prior to Admission medications   Medication Sig Start Date End Date Taking? Authorizing Provider  clobetasol cream (TEMOVATE) 0.05 % Apply topically 2 (two) times daily. 08/05/12  Yes Thao P Le, DO  famotidine (PEPCID) 20 MG tablet Take 1 tablet (20 mg total) by mouth 2 (two) times daily. 05/08/13  Yes Sunnie Nielsen, MD  levothyroxine (SYNTHROID, LEVOTHROID) 100 MCG tablet Take 1 tablet (100 mcg total) by mouth daily. 03/29/13  Yes Elvina Sidle, MD  lisinopril-hydrochlorothiazide (PRINZIDE,ZESTORETIC) 20-12.5 MG per tablet Take 1 tablet by mouth daily. 03/29/13  Yes Elvina Sidle, MD  metoprolol (TOPROL XL) 200 MG 24 hr tablet Take 1 tablet (200 mg  total) by mouth daily. 03/29/13  Yes Elvina Sidle, MD  valACYclovir (VALTREX) 500 MG tablet Take 1 tablet (500 mg total) by mouth 2 (two) times daily. 03/29/13  Yes Elvina Sidle, MD   History   Social History   Marital Status: Married    Spouse Name: N/A    Number of Children: N/A   Years of Education: N/A   Occupational History   Not on file.   Social History Main Topics   Smoking status: Never Smoker    Smokeless tobacco: Not on file   Alcohol Use: No   Drug Use: No   Sexual Activity: Yes   Other Topics Concern   Not on file   Social History Narrative   No narrative on file     Review of Systems  Constitutional: Negative for fever and unexpected weight change.  Respiratory: Negative for shortness of breath.   Cardiovascular: Negative for chest pain.  Neurological: Negative for speech difficulty, light-headedness and headaches.  All other systems reviewed and are negative.      Objective:  Physical Exam  Vitals reviewed. Constitutional: She is oriented to person, place, and time. She appears well-developed and well-nourished. No distress.  HENT:  Head: Normocephalic and atraumatic.  Right Ear: Hearing, tympanic membrane, external ear and ear canal normal.  Left Ear: Hearing, tympanic membrane, external ear and ear canal normal.  Nose: Nose normal.  Mouth/Throat: Oropharynx is clear and moist. No oropharyngeal exudate.  Eyes: Conjunctivae and EOM are normal. Pupils are equal, round, and reactive to light.  Neck: Carotid bruit is not present. Thyromegaly present.  Fullness of thyroid without palpable nodule  Cardiovascular: Normal rate, regular rhythm, normal heart sounds and intact distal pulses.   No murmur heard. Pulmonary/Chest: Effort normal and breath sounds normal. No respiratory distress. She has no wheezes. She has no rhonchi.  Abdominal: Soft. She exhibits no pulsatile midline mass. There is no tenderness.  Musculoskeletal: She exhibits  no edema.  Neurological: She is alert and oriented to person, place, and time.  Skin: Skin is warm and dry. No rash noted.  Right axilla: 2 cm erythematous lump with induration of approximately 3-4 cm.  No central fluctuance.    Psychiatric: She has a normal mood and affect. Her behavior is normal.       BP 154/96   Pulse 97   Temp(Src) 98.3 F (36.8 C) (Oral)   Ht 5' 8.5" (1.74 m)   Wt 230 lb (104.327 kg)   BMI 34.46 kg/m2   SpO2 97% Assessment & Plan:   Holly Hays is a 48 y.o. female HSV (herpes simplex virus) infection - Plan: valACYclovir (VALTREX) 500 MG tablet, TSH, CANCELED: Basic metabolic panel - stable. Asx on preventative dosing.   Hypertension -  Plan: metoprolol (TOPROL XL) 200 MG 24 hr tablet, lisinopril-hydrochlorothiazide (PRINZIDE,ZESTORETIC) 20-12.5 MG per tablet, TSH, Comprehensive metabolic panel, CANCELED: Basic metabolic panel - uncontrolled, but nonadherent to meds past few days. recheck with personal meter in next 2 weeks. Restart prior med doses.   Hypothyroidism - Plan: levothyroxine (SYNTHROID, LEVOTHROID) 100 MCG tablet, TSH, CANCELED: Basic metabolic panel. Check tsh. Cont same dose synthroid for now.   GERD (gastroesophageal reflux disease) - Plan: famotidine (PEPCID) 20 MG tablet, TSH, CANCELED: Basic metabolic panel. -likely cause of prior chest sx's, but discussed lipid panel for risk stratification when fasting. Cont to avoid trigger foods, and cont pepcid.   Hidradenitis suppurativa of right axilla - Plan: doxycycline (VIBRA-TABS) 100 MG tablet - warm compresses, h/o as below. No abscess for I and D at this time, but rtc precautions discussed.   Discussed establishing primary provider at [facility] as seen multiple providers - will follow up with me in 2 weeks.   Meds ordered this encounter  Medications   valACYclovir (VALTREX) 500 MG tablet    Sig: Take 1 tablet (500 mg total) by mouth daily.    Dispense:  90 tablet    Refill:  3   metoprolol  (TOPROL XL) 200 MG 24 hr tablet    Sig: Take 1 tablet (200 mg total) by mouth daily.    Dispense:  30 tablet    Refill:  5   levothyroxine (SYNTHROID, LEVOTHROID) 100 MCG tablet    Sig: Take 1 tablet (100 mcg total) by mouth daily.    Dispense:  90 tablet    Refill:  1   famotidine (PEPCID) 20 MG tablet    Sig: Take 1 tablet (20 mg total) by mouth 2 (two) times daily.    Dispense:  30 tablet    Refill:  2   lisinopril-hydrochlorothiazide (PRINZIDE,ZESTORETIC) 20-12.5 MG per tablet    Sig: Take 1 tablet by mouth daily.    Dispense:  90 tablet    Refill:  1   doxycycline (VIBRA-TABS) 100 MG tablet    Sig: Take 1 tablet (100 mg total) by mouth 2 (two) times daily.    Dispense:  20 tablet    Refill:  0   Patient Instructions  Keep a record of your blood pressures outside of the office and bring them to the next office visit in the next few weeks along with your meter. You should receive a call or letter about your lab results within the next week to 10 days.  Fasting for 8 hours if possible for next office visit - will call you to schedule this.   Warm compresses to R axilla next few days - return if any increase in swelling or worsening. Can start antibiotic tomorrow if not improving.   Hidradenitis Suppurativa, Sweat Gland Abscess Hidradenitis suppurativa is a long lasting (chronic), uncommon disease of the sweat glands. With this, boil-like lumps and scarring develop in the groin, some times under the arms (axillae), and under the breasts. It may also uncommonly occur behind the ears, in the crease of the buttocks, and around the genitals.  CAUSES  The cause is from a blocking of the sweat glands. They then become infected. It may cause drainage and odor. It is not contagious. So it cannot be given to someone else. It most often shows up in puberty (about 72 to 46 years of age). But it may happen much later. It is similar to acne which is a disease of the sweat glands. This  condition is slightly more common in African-Americans and women. SYMPTOMS   Hidradenitis usually starts as one or more red, tender, swellings in the groin or under the arms (axilla).  Over a period of hours to days the lesions get larger. They often open to the skin surface, draining clear to yellow-colored fluid.  The infected area heals with scarring. DIAGNOSIS  Your caregiver makes this diagnosis by looking at you. Sometimes cultures (growing germs on plates in the lab) may be taken. This is to see what germ (bacterium) is causing the infection.  TREATMENT   Topical germ killing medicine applied to the skin (antibiotics) are the treatment of choice. Antibiotics taken by mouth (systemic) are sometimes needed when the condition is getting worse or is severe.  Avoid tight-fitting clothing which traps moisture in.  Dirt does not cause hidradenitis and it is not caused by poor hygiene.  Involved areas should be cleaned daily using an antibacterial soap. Some patients find that the liquid form of Lever 2000, applied to the involved areas as a lotion after bathing, can help reduce the odor related to this condition.  Sometimes surgery is needed to drain infected areas or remove scarred tissue. Removal of large amounts of tissue is used only in severe cases.  Birth control pills may be helpful.  Oral retinoids (vitamin A derivatives) for 6 to 12 months which are effective for acne may also help this condition.  Weight loss will improve but not cure hidradenitis. It is made worse by being overweight. But the condition is not caused by being overweight.  This condition is more common in people who have had acne.  It may become worse under stress. There is no medical cure for hidradenitis. It can be controlled, but not cured. The condition usually continues for years with periods of getting worse and getting better (remission). Document Released: 12/27/2003 Document Revised: 08/06/2011  Document Reviewed: 01/12/2008 [facility] Patient Information 2014 [place]Care, [place].        I personally performed the services described in this documentation, which was scribed in my presence. The recorded information has been reviewed and is accurate.

## 2013-07-15 ENCOUNTER — Telehealth: Payer: Self-pay | Admitting: Family Medicine

## 2013-07-15 NOTE — Telephone Encounter (Signed)
Called and left vm for patient to give us a call back Dr Neva SeatGreene would like patient to schedule an appointment   In 2-3 wks for fasting labs

## 2013-11-06 ENCOUNTER — Telehealth: Payer: Self-pay

## 2013-11-06 NOTE — Telephone Encounter (Signed)
Would like to know if Dr.Greene could put in order for her to have her labs done at the appointment center, was needing cholesterol labs

## 2013-11-08 NOTE — Telephone Encounter (Signed)
It appears she has an appt in 2 weeks with me.  She can have fasting bloodwork drawn that morning, then I can place orders for cholesterol test and other necessary tests as determined at appointment later that day. Let me know if there are questions.

## 2013-11-09 NOTE — Telephone Encounter (Signed)
LMVM to CB. 

## 2013-11-16 NOTE — Telephone Encounter (Signed)
Pt has appt 11/23/13 2pm. LM confirming appt and let her know that she can come in the morning for the fasting labs.

## 2013-11-23 ENCOUNTER — Ambulatory Visit (INDEPENDENT_AMBULATORY_CARE_PROVIDER_SITE_OTHER): Payer: Federal, State, Local not specified - PPO | Admitting: Family Medicine

## 2013-11-23 ENCOUNTER — Encounter: Payer: Self-pay | Admitting: Family Medicine

## 2013-11-23 VITALS — BP 138/84 | HR 73 | Temp 98.6°F | Resp 16 | Ht 68.0 in | Wt 226.6 lb

## 2013-11-23 DIAGNOSIS — K219 Gastro-esophageal reflux disease without esophagitis: Secondary | ICD-10-CM

## 2013-11-23 DIAGNOSIS — E039 Hypothyroidism, unspecified: Secondary | ICD-10-CM

## 2013-11-23 DIAGNOSIS — I10 Essential (primary) hypertension: Secondary | ICD-10-CM

## 2013-11-23 LAB — LIPID PANEL
CHOLESTEROL: 174 mg/dL (ref 0–200)
HDL: 38 mg/dL — ABNORMAL LOW (ref >39)
LDL Cholesterol: 116 mg/dL — ABNORMAL HIGH (ref 0–99)
Total CHOL/HDL Ratio: 4.6 Ratio
Triglycerides: 99 mg/dL (ref <150)
VLDL: 20 mg/dL (ref 0–40)

## 2013-11-23 MED ORDER — LISINOPRIL-HYDROCHLOROTHIAZIDE 20-12.5 MG PO TABS: 1.0000 | ORAL_TABLET | Freq: Every day | ORAL | Status: DC

## 2013-11-23 MED ORDER — FAMOTIDINE 20 MG PO TABS
20.0000 mg | ORAL_TABLET | Freq: Two times a day (BID) | ORAL | Status: DC
Start: 2013-11-23 — End: 2014-11-11

## 2013-11-23 MED ORDER — LEVOTHYROXINE SODIUM 100 MCG PO TABS: 100.0000 ug | ORAL_TABLET | Freq: Every day | ORAL | Status: DC

## 2013-11-23 MED ORDER — METOPROLOL SUCCINATE ER 200 MG PO TB24: 200.0000 mg | ORAL_TABLET | Freq: Every day | ORAL | Status: DC

## 2013-11-23 NOTE — Patient Instructions (Signed)
You should receive a call or letter about your lab results within the next week to 10 days.  No change in meds at this time.  recheck in 3 months for physical.

## 2013-11-23 NOTE — Progress Notes (Signed)
Subjective:   This chart was scribed for Shade Flood, MD by Arlan Organ, [facility] Scribe. This patient was seen in room 24 and the patient's care was started 3:03 PM.    Patient ID: Holly Hays, female    DOB: 05/22/1966, 48 y.o.   MRN: [id number]  Chief Complaint  Patient presents with  . Follow-up    HTN   HPI  HPI Comments: Holly Hays is a 48 y.o. female who presents to [facility] for a hypertension also with history of hypothyroidism follow up today. Prior pt of Dr. Perrin Maltese. Pt will also be establishing care with me today.  1. HTN: Last seen 06/2012. At that visit her BP was 154/96. She has been off of medication for a few days at that time. Pt is currently taking Zestoretic 20-12.5 mg and Toprol 200 mg once daily. Pt has recently resumed daily dosages as directed. No lightheadedness or dizziness with medications. Pt was seen in ED 04/2013 for chest pain. EKG at that time with no acute findings, however, ST changes noted. Symptoms improved with GI cocktail, Protonix, and Pepcid. Suspected reflux cause. Continued Pepcid 20 mg at last office visit. Advised for avoid trigger foods for GERD. Blood pressures have been running low around 130 systolic and 80 diastolic No SOB, chest pain, or abdominal pain.  Lab Results  Component Value Date   CREATININE 0.86 07/05/2013    2. Hypothyroidism: Pt is currently taking Synthroid 100 mcg QD No chills or no unexpected weight changes. No issues with medications.  Lab Results  Component Value Date   TSH 0.431 07/05/2013     Patient Active Problem List   Diagnosis Date Noted  . Hypertension 08/06/2012  . Hypothyroidism 08/06/2012  . Thyroid goiter 08/06/2012   Past Medical History  Diagnosis Date  . Hypertension   . Thyroid disease    Past Surgical History  Procedure Laterality Date  . Breast reduction     Allergies  Allergen Reactions  . Latex Itching and Rash   Prior to  Admission medications   Medication Sig Start Date End Date Taking? Authorizing Provider  clobetasol cream (TEMOVATE) 0.05 % Apply topically 2 (two) times daily. 08/05/12   Thao P Le, DO  doxycycline (VIBRA-TABS) 100 MG tablet Take 1 tablet (100 mg total) by mouth 2 (two) times daily. 07/05/13   Shade Flood, MD  famotidine (PEPCID) 20 MG tablet Take 1 tablet (20 mg total) by mouth 2 (two) times daily. 07/05/13   Shade Flood, MD  levothyroxine (SYNTHROID, LEVOTHROID) 100 MCG tablet Take 1 tablet (100 mcg total) by mouth daily. 07/05/13   Shade Flood, MD  lisinopril-hydrochlorothiazide (PRINZIDE,ZESTORETIC) 20-12.5 MG per tablet Take 1 tablet by mouth daily. 07/05/13   Shade Flood, MD  metoprolol (TOPROL XL) 200 MG 24 hr tablet Take 1 tablet (200 mg total) by mouth daily. 07/05/13   Shade Flood, MD  valACYclovir (VALTREX) 500 MG tablet Take 1 tablet (500 mg total) by mouth daily. 07/05/13   Shade Flood, MD   History   Social History  . Marital Status: Married    Spouse Name: N/A    Number of Children: N/A  . Years of Education: N/A   Occupational History  . Not on file.   Social History Main Topics  . Smoking status: Never Smoker   . Smokeless tobacco: Not on file  . Alcohol Use: No  . Drug  Use: No  . Sexual Activity: Yes   Other Topics Concern  . Not on file   Social History Narrative  . No narrative on file     Review of Systems  Constitutional: Negative for fatigue and unexpected weight change.  Respiratory: Negative for chest tightness and shortness of breath.   Cardiovascular: Negative for chest pain, palpitations and leg swelling.  Gastrointestinal: Negative for abdominal pain and blood in stool.  Neurological: Negative for dizziness, syncope, light-headedness and headaches.     Objective:  Physical Exam  Vitals reviewed. Constitutional: She is oriented to person, place, and time. She appears well-developed and well-nourished.  HENT:  Head:  Normocephalic and atraumatic.  Eyes: Conjunctivae and EOM are normal. Pupils are equal, round, and reactive to light.  Neck: Carotid bruit is not present.  Some mild fullness of thyroid without any specified nodules  Cardiovascular: Normal rate, regular rhythm, normal heart sounds and intact distal pulses.   Pulmonary/Chest: Effort normal and breath sounds normal.  Abdominal: Soft. She exhibits no pulsatile midline mass. There is no tenderness.  Musculoskeletal:  No lower extremity swelling  Neurological: She is alert and oriented to person, place, and time.  Skin: Skin is warm and dry.  Psychiatric: She has a normal mood and affect. Her behavior is normal.    Filed Vitals:   11/23/13 1420  BP: 138/84  Pulse: 73  Temp: 98.6 F (37 C)  TempSrc: Oral  Resp: 16  Height: 5\' 8"  (1.727 m)  Weight: 226 lb 9.6 oz (102.785 kg)  SpO2: 98%     Assessment & Plan:   Holly Hays is a 48 y.o. female Essential hypertension - Plan: Lipid panel, lisinopril-hydrochlorothiazide (PRINZIDE,ZESTORETIC) 20-12.5 MG per tablet, metoprolol (TOPROL XL) 200 MG 24 hr tablet  - now improved on prior dosing of meds. Continue same regimen.   - check lipids.   Unspecified hypothyroidism  - prior stable levels. Cont same dose synthroid.   Gastroesophageal reflux disease, esophagitis presence not specified - Plan: famotidine (PEPCID) 20 MG tablet   - likely cause of prior chest pain, but if chest pain recurs - rtc or ER.  Cont to avoid trigger foods.   Plan on cpe in next 3 months.   Meds ordered this encounter  Medications  . famotidine (PEPCID) 20 MG tablet    Sig: Take 1 tablet (20 mg total) by mouth 2 (two) times daily.    Dispense:  90 tablet    Refill:  0  . levothyroxine (SYNTHROID, LEVOTHROID) 100 MCG tablet    Sig: Take 1 tablet (100 mcg total) by mouth daily.    Dispense:  90 tablet    Refill:  0  . lisinopril-hydrochlorothiazide (PRINZIDE,ZESTORETIC) 20-12.5 MG per tablet    Sig: Take  1 tablet by mouth daily.    Dispense:  90 tablet    Refill:  0  . metoprolol (TOPROL XL) 200 MG 24 hr tablet    Sig: Take 1 tablet (200 mg total) by mouth daily.    Dispense:  90 tablet    Refill:  0   Patient Instructions  You should receive a call or letter about your lab results within the next week to 10 days.  No change in meds at this time.  recheck in 3 months for physical.  I personally performed the services described in this documentation, which was scribed in my presence. The recorded information has been reviewed and considered, and addended by me as needed.

## 2014-01-22 ENCOUNTER — Ambulatory Visit (INDEPENDENT_AMBULATORY_CARE_PROVIDER_SITE_OTHER): Payer: Federal, State, Local not specified - PPO | Admitting: Family Medicine

## 2014-01-22 ENCOUNTER — Other Ambulatory Visit: Payer: Self-pay | Admitting: Family Medicine

## 2014-01-22 VITALS — BP 138/92 | HR 66 | Temp 98.2°F | Resp 18 | Ht 67.5 in | Wt 228.0 lb

## 2014-01-22 DIAGNOSIS — R220 Localized swelling, mass and lump, head: Secondary | ICD-10-CM

## 2014-01-22 DIAGNOSIS — I10 Essential (primary) hypertension: Secondary | ICD-10-CM

## 2014-01-22 DIAGNOSIS — R221 Localized swelling, mass and lump, neck: Secondary | ICD-10-CM

## 2014-01-22 DIAGNOSIS — E049 Nontoxic goiter, unspecified: Secondary | ICD-10-CM

## 2014-01-22 DIAGNOSIS — E039 Hypothyroidism, unspecified: Secondary | ICD-10-CM

## 2014-01-22 DIAGNOSIS — T7840XA Allergy, unspecified, initial encounter: Secondary | ICD-10-CM

## 2014-01-22 LAB — THYROID PROFILE - CHCC
FREE THYROXINE INDEX: 1.9 (ref 1.4–3.8)
T3 Uptake: 28 % (ref 22.0–35.0)
T4, Total: 6.7 ug/dL (ref 4.5–12.0)

## 2014-01-22 MED ORDER — METHYLPREDNISOLONE ACETATE 80 MG/ML IJ SUSP
120.0000 mg | Freq: Once | INTRAMUSCULAR | Status: AC
  Administered 2014-01-22: 120 mg via INTRAMUSCULAR

## 2014-01-22 MED ORDER — TRIAMCINOLONE ACETONIDE 0.1 % EX CREA: 1.0000 "application " | TOPICAL_CREAM | Freq: Two times a day (BID) | CUTANEOUS | Status: DC

## 2014-01-22 NOTE — Progress Notes (Signed)
Subjective 48 year old lady who just began using a new type of a lip balm called an "Egg".  She started using it yesterday. Last night her lips were swollen little bit. This morning they were very swollen and developed some crusting along him. They have gone down a little since then. She realizes she is apparently allergic to that.  She does have a history of hypothyroidism and takes thyroid hormone replacement. I note that on her last visit Dr. Chilton Si noted that the thyroid was mildly enlarged.  Objective: Lips are swollen. Throat clear. Chest clear. Heart regular. Neck is visibly swollen over the thyroid. I can palpate a significantly enlarged goiter without nodules.  Nontender.  Assessment: Allergic swelling of lips Hypothyroidism with newly enlarged goiter Hypertension, elevated today  Plan: I think she needs to see endocrinology for the thyroid Will treat the reaction with an injection of Depo-Medrol 120 mg and taking antihistamines.

## 2014-01-22 NOTE — Patient Instructions (Signed)
Take Zyrtec one daily (can take one twice daily for a day or 2 if needed) for the allergic reaction and swelling  You've been given a shot of Depo-Medrol 120 mg which is a type of cortisone to knock out the allergic reaction  Use the triamcinolone cream, only a small amount on the lips, 2 or 3 times a day for 3 or 4 days only.  Avoid the lip balm that caused the problem  Return if worse. If swelling gets so bad he have any difficulty breathing call 911 or get to the emergency room immediately.  Endocrinology referral is being made for your thyroid because I feel it is getting significantly larger.  Followup with Dr. Chilton Si in a couple of weeks to let him recheck the gland.

## 2014-01-27 ENCOUNTER — Encounter: Payer: Self-pay | Admitting: *Deleted

## 2014-02-10 ENCOUNTER — Encounter: Payer: Self-pay | Admitting: Endocrinology

## 2014-02-22 ENCOUNTER — Encounter: Payer: Federal, State, Local not specified - PPO | Admitting: Family Medicine

## 2014-03-08 ENCOUNTER — Encounter: Payer: Federal, State, Local not specified - PPO | Admitting: Family Medicine

## 2014-04-15 ENCOUNTER — Ambulatory Visit (INDEPENDENT_AMBULATORY_CARE_PROVIDER_SITE_OTHER): Payer: Federal, State, Local not specified - PPO | Admitting: Emergency Medicine

## 2014-04-15 VITALS — BP 138/90 | HR 86 | Temp 98.0°F | Resp 16 | Ht 68.0 in | Wt 225.0 lb

## 2014-04-15 DIAGNOSIS — L309 Dermatitis, unspecified: Secondary | ICD-10-CM

## 2014-04-15 DIAGNOSIS — R220 Localized swelling, mass and lump, head: Secondary | ICD-10-CM

## 2014-04-15 DIAGNOSIS — T7840XA Allergy, unspecified, initial encounter: Secondary | ICD-10-CM

## 2014-04-15 MED ORDER — TRIAMCINOLONE ACETONIDE 0.1 % EX CREA: 1.0000 "application " | TOPICAL_CREAM | Freq: Two times a day (BID) | CUTANEOUS | Status: DC

## 2014-04-15 NOTE — Patient Instructions (Signed)
Eczema Eczema, also called atopic dermatitis, is a skin disorder that causes inflammation of the skin. It causes a red rash and dry, scaly skin. The skin becomes very itchy. Eczema is generally worse during the cooler winter months and often improves with the warmth of summer. Eczema usually starts showing signs in infancy. Some children outgrow eczema, but it may last through adulthood.  CAUSES  The exact cause of eczema is not known, but it appears to run in families. People with eczema often have a family history of eczema, allergies, asthma, or hay fever. Eczema is not contagious. Flare-ups of the condition may be caused by:   Contact with something you are sensitive or allergic to.   Stress. SIGNS AND SYMPTOMS  Dry, scaly skin.   Red, itchy rash.   Itchiness. This may occur before the skin rash and may be very intense.  DIAGNOSIS  The diagnosis of eczema is usually made based on symptoms and medical history. TREATMENT  Eczema cannot be cured, but symptoms usually can be controlled with treatment and other strategies. A treatment plan might include:  Controlling the itching and scratching.   Use over-the-counter antihistamines as directed for itching. This is especially useful at night when the itching tends to be worse.   Use over-the-counter steroid creams as directed for itching.   Avoid scratching. Scratching makes the rash and itching worse. It may also result in a skin infection (impetigo) due to a break in the skin caused by scratching.   Keeping the skin well moisturized with creams every day. This will seal in moisture and help prevent dryness. Lotions that contain alcohol and water should be avoided because they can dry the skin.   Limiting exposure to things that you are sensitive or allergic to (allergens).   Recognizing situations that cause stress.   Developing a plan to manage stress.  HOME CARE INSTRUCTIONS   Only take over-the-counter or  prescription medicines as directed by your health care provider.   Do not use anything on the skin without checking with your health care provider.   Keep baths or showers short (5 minutes) in warm (not hot) water. Use mild cleansers for bathing. These should be unscented. You may add nonperfumed bath oil to the bath water. It is best to avoid soap and bubble bath.   Immediately after a bath or shower, when the skin is still damp, apply a moisturizing ointment to the entire body. This ointment should be a petroleum ointment. This will seal in moisture and help prevent dryness. The thicker the ointment, the better. These should be unscented.   Keep fingernails cut short. Children with eczema may need to wear soft gloves or mittens at night after applying an ointment.   Dress in clothes made of cotton or cotton blends. Dress lightly, because heat increases itching.   A child with eczema should stay away from anyone with fever blisters or cold sores. The virus that causes fever blisters (herpes simplex) can cause a serious skin infection in children with eczema. SEEK MEDICAL CARE IF:   Your itching interferes with sleep.   Your rash gets worse or is not better within 1 week after starting treatment.   You see pus or soft yellow scabs in the rash area.   You have a fever.   You have a rash flare-up after contact with someone who has fever blisters.  Document Released: 05/11/2000 Document Revised: 03/04/2013 Document Reviewed: 12/15/2012 Exit[employer] Patient Information 2015 [employer]. This information   is not intended to replace advice given to you by your health care provider. Make sure you discuss any questions you have with your health care provider.  

## 2014-04-15 NOTE — Progress Notes (Signed)
[facility] [address], [place] [address] [phone]  Date:  04/15/2014   Name:  Holly Hays   DOB:  [birth year]   MRN:  [id number]  PCP:  Tally DueGUEST, CHRIS WARREN, MD    Chief Complaint: Rash   History of Present Illness:  Holly Hays is a 48 y.o. very pleasant female patient who presents with the following:  Developed hives on her face Monday.   Works in [employer].  No new cosmetic, personal products, medications.  No new food No fever or chills. Cough coryza, nausea or vomiting, stool change, GU or GYN symptoms. No history of hives. No improvement with over the counter medications or other home remedies.  Denies other complaint or health concern today.   Patient Active Problem List   Diagnosis Date Noted  . Hypertension 08/06/2012  . Hypothyroidism 08/06/2012  . Thyroid goiter 08/06/2012    Past Medical History  Diagnosis Date  . Hypertension   . Thyroid disease     Past Surgical History  Procedure Laterality Date  . Breast reduction      History  Substance Use Topics  . Smoking status: Never Smoker   . Smokeless tobacco: Not on file  . Alcohol Use: No    History reviewed. No pertinent family history.  Allergies  Allergen Reactions  . Latex Itching and Rash    Medication list has been reviewed and updated.  Current Outpatient Prescriptions on File Prior to Visit  Medication Sig Dispense Refill  . clobetasol cream (TEMOVATE) 0.05 % Apply topically 2 (two) times daily. 60 g 0  . doxycycline (VIBRA-TABS) 100 MG tablet Take 1 tablet (100 mg total) by mouth 2 (two) times daily. 20 tablet 0  . famotidine (PEPCID) 20 MG tablet Take 1 tablet (20 mg total) by mouth 2 (two) times daily. 90 tablet 0  . levothyroxine (SYNTHROID, LEVOTHROID) 100 MCG tablet Take 1 tablet (100 mcg total) by mouth daily. 90 tablet 0  . lisinopril-hydrochlorothiazide (PRINZIDE,ZESTORETIC) 20-12.5 MG per tablet Take 1 tablet by mouth daily. 90 tablet 0  .  metoprolol (TOPROL XL) 200 MG 24 hr tablet Take 1 tablet (200 mg total) by mouth daily. 90 tablet 0  . triamcinolone cream (KENALOG) 0.1 % Apply 1 application topically 2 (two) times daily. 15 g 0  . valACYclovir (VALTREX) 500 MG tablet Take 1 tablet (500 mg total) by mouth daily. 90 tablet 3   No current facility-administered medications on file prior to visit.    Review of Systems:  As per HPI, otherwise negative.    Physical Examination: Filed Vitals:   04/15/14 1456  BP: 138/90  Pulse: 86  Temp: 98 F (36.7 C)  Resp: 16   Filed Vitals:   04/15/14 1456  Height: 5\' 8"  (1.727 m)  Weight: 225 lb (102.059 kg)   Body mass index is 34.22 kg/(m^2). Ideal Body Weight: Weight in (lb) to have BMI = 25: 164.1   GEN: WDWN, NAD, Non-toxic, Alert & Oriented x 3 HEENT: Atraumatic, Normocephalic.  Ears and Nose: No external deformity. EXTR: No clubbing/cyanosis/edema NEURO: Normal gait.  PSYCH: Normally interactive. Conversant. Not depressed or anxious appearing.  Calm demeanor.  SKIN:  Eczema neck   Assessment and Plan: Eczema Tac  Signed,  Phillips OdorJeffery Shalini Mair, MD

## 2014-05-24 ENCOUNTER — Other Ambulatory Visit: Payer: Self-pay | Admitting: Family Medicine

## 2014-11-11 ENCOUNTER — Ambulatory Visit (INDEPENDENT_AMBULATORY_CARE_PROVIDER_SITE_OTHER): Payer: Federal, State, Local not specified - PPO | Admitting: Emergency Medicine

## 2014-11-11 VITALS — BP 140/90 | HR 96 | Temp 99.5°F | Resp 16 | Ht 70.0 in | Wt 228.0 lb

## 2014-11-11 DIAGNOSIS — J0140 Acute pansinusitis, unspecified: Secondary | ICD-10-CM | POA: Diagnosis not present

## 2014-11-11 DIAGNOSIS — K219 Gastro-esophageal reflux disease without esophagitis: Secondary | ICD-10-CM | POA: Diagnosis not present

## 2014-11-11 DIAGNOSIS — J209 Acute bronchitis, unspecified: Secondary | ICD-10-CM | POA: Diagnosis not present

## 2014-11-11 DIAGNOSIS — I10 Essential (primary) hypertension: Secondary | ICD-10-CM | POA: Diagnosis not present

## 2014-11-11 DIAGNOSIS — E039 Hypothyroidism, unspecified: Secondary | ICD-10-CM | POA: Diagnosis not present

## 2014-11-11 MED ORDER — AMOXICILLIN-POT CLAVULANATE 875-125 MG PO TABS: 1.0000 | ORAL_TABLET | Freq: Two times a day (BID) | ORAL | Status: DC

## 2014-11-11 MED ORDER — LEVOTHYROXINE SODIUM 100 MCG PO TABS: 100.0000 ug | ORAL_TABLET | Freq: Every day | ORAL | Status: DC

## 2014-11-11 MED ORDER — HYDROCOD POLST-CPM POLST ER 10-8 MG/5ML PO SUER: 5.0000 mL | Freq: Two times a day (BID) | ORAL | Status: DC

## 2014-11-11 MED ORDER — VALACYCLOVIR HCL 500 MG PO TABS: ORAL_TABLET | ORAL | Status: DC

## 2014-11-11 MED ORDER — METOPROLOL SUCCINATE ER 200 MG PO TB24: 200.0000 mg | ORAL_TABLET | Freq: Every day | ORAL | Status: DC

## 2014-11-11 MED ORDER — LISINOPRIL-HYDROCHLOROTHIAZIDE 20-12.5 MG PO TABS
1.0000 | ORAL_TABLET | Freq: Every day | ORAL | Status: DC
Start: 2014-11-11 — End: 2015-05-07

## 2014-11-11 MED ORDER — FAMOTIDINE 20 MG PO TABS: 20.0000 mg | ORAL_TABLET | Freq: Two times a day (BID) | ORAL | Status: DC

## 2014-11-11 NOTE — Patient Instructions (Signed)

## 2014-11-11 NOTE — Progress Notes (Signed)
Subjective:  Patient ID: Holly Hays, female    DOB: Dec 26, 1965  Age: 49 y.o. MRN: [id number]  CC: Fever; Cough; Generalized Body Aches; and Medication Refill   HPI Holly Hays presents  with a history of 4 days cough nasal congestion and postnasal drainage. She has a cough productive of purulent sputum she denies any wheezing or shortness of breath. She denies any nausea or vomiting. No nausea or fever or chills. She denies any rash. Has no sore throat. Has no improvement with over-the-counter medication  Outpatient Prescriptions Prior to Visit  Medication Sig Dispense Refill  . clobetasol cream (TEMOVATE) 0.05 % Apply topically 2 (two) times daily. 60 g 0  . doxycycline (VIBRA-TABS) 100 MG tablet Take 1 tablet (100 mg total) by mouth 2 (two) times daily. 20 tablet 0  . triamcinolone cream (KENALOG) 0.1 % Apply 1 application topically 2 (two) times daily. 15 g 1  . famotidine (PEPCID) 20 MG tablet Take 1 tablet (20 mg total) by mouth 2 (two) times daily. 90 tablet 0  . levothyroxine (SYNTHROID, LEVOTHROID) 100 MCG tablet Take 1 tablet (100 mcg total) by mouth daily. 90 tablet 0  . lisinopril-hydrochlorothiazide (PRINZIDE,ZESTORETIC) 20-12.5 MG per tablet Take 1 tablet by mouth daily. 90 tablet 0  . metoprolol (TOPROL XL) 200 MG 24 hr tablet Take 1 tablet (200 mg total) by mouth daily. 90 tablet 0  . valACYclovir (VALTREX) 500 MG tablet TAKE ONE CAPLET BY MOUTH ONCE DAILY 90 tablet 1   No facility-administered medications prior to visit.    History   Social History  . Marital Status: Married    Spouse Name: N/A  . Number of Children: N/A  . Years of Education: N/A   Social History Main Topics  . Smoking status: Never Smoker   . Smokeless tobacco: Not on file  . Alcohol Use: No  . Drug Use: No  . Sexual Activity: Yes   Other Topics Concern  . None   Social History Narrative    No family history on file.  Past Medical History  Diagnosis Date  . Hypertension     . Thyroid disease      Review of Systems  Constitutional: Positive for chills and fatigue. Negative for fever and appetite change.  HENT: Positive for congestion and postnasal drip. Negative for ear pain, sinus pressure and sore throat.   Eyes: Negative for pain and redness.  Respiratory: Positive for cough. Negative for choking, shortness of breath and wheezing.   Cardiovascular: Negative for leg swelling.  Gastrointestinal: Negative for nausea, vomiting, abdominal pain, diarrhea, constipation and blood in stool.  Endocrine: Negative for polyuria.  Genitourinary: Negative for dysuria, urgency, frequency and flank pain.  Musculoskeletal: Negative for gait problem.  Skin: Negative for rash.  Neurological: Negative for weakness and headaches.  Psychiatric/Behavioral: Negative for confusion and decreased concentration. The patient is not nervous/anxious.     Objective:  BP 140/90 mmHg  Pulse 96  Temp(Src) 99.5 F (37.5 C)  Resp 16  Ht  (1.778 m)  Wt 228 lb (103.42 kg)  BMI 32.71 kg/m2  SpO2 98%  LMP 09/26/2014  BP Readings from Last 3 Encounters:  11/11/14 140/90  04/15/14 138/90  01/22/14 138/92    Wt Readings from Last 3 Encounters:  11/11/14 228 lb (103.42 kg)  04/15/14 225 lb (102.059 kg)  01/22/14 228 lb (103.42 kg)    Physical Exam  Constitutional: She is oriented to person, place, and time. She appears well-developed and  well-nourished. No distress.  HENT:  Head: Normocephalic and atraumatic.  Right Ear: External ear normal.  Left Ear: External ear normal.  Nose: Nose normal.  Eyes: Conjunctivae and EOM are normal. Pupils are equal, round, and reactive to light. No scleral icterus.  Neck: Normal range of motion. Neck supple. No tracheal deviation present.  Cardiovascular: Normal rate, regular rhythm and normal heart sounds.   Pulmonary/Chest: Effort normal. No respiratory distress. She has no wheezes. She has no rales.  Abdominal: She exhibits no  mass. There is no tenderness. There is no rebound and no guarding.  Musculoskeletal: She exhibits no edema.  Lymphadenopathy:    She has no cervical adenopathy.  Neurological: She is alert and oriented to person, place, and time. Coordination normal.  Skin: Skin is warm and dry. No rash noted.  Psychiatric: She has a normal mood and affect. Her behavior is normal.    Lab Results  Component Value Date   WBC 6.7 05/08/2013   HGB 14.6 05/08/2013   HCT 43.0 05/08/2013   PLT 229 05/08/2013   GLUCOSE 136* 07/05/2013   CHOL 174 11/23/2013   TRIG 99 11/23/2013   HDL 38* 11/23/2013   LDLCALC 116* 11/23/2013   ALT 18 07/05/2013   AST 18 07/05/2013   NA 137 07/05/2013   K 3.7 07/05/2013   CL 104 07/05/2013   CREATININE 0.86 07/05/2013   BUN 9 07/05/2013   CO2 23 07/05/2013   TSH 0.431 07/05/2013   MICROALBUR 2.30* 08/05/2012      .  Assessment & Plan:   Holly Hays was seen today for fever, cough, generalized body aches and medication refill.  Diagnoses and all orders for this visit:  Acute bronchitis, unspecified organism  Acute pansinusitis, recurrence not specified  Gastroesophageal reflux disease, esophagitis presence not specified Orders: -     famotidine (PEPCID) 20 MG tablet; Take 1 tablet (20 mg total) by mouth 2 (two) times daily.  Hypothyroidism, unspecified hypothyroidism type Orders: -     levothyroxine (SYNTHROID, LEVOTHROID) 100 MCG tablet; Take 1 tablet (100 mcg total) by mouth daily.  Essential hypertension Orders: -     lisinopril-hydrochlorothiazide (PRINZIDE,ZESTORETIC) 20-12.5 MG per tablet; Take 1 tablet by mouth daily. -     metoprolol (TOPROL XL) 200 MG 24 hr tablet; Take 1 tablet (200 mg total) by mouth daily.  Other orders -     amoxicillin-clavulanate (AUGMENTIN) 875-125 MG per tablet; Take 1 tablet by mouth 2 (two) times daily. -     chlorpheniramine-HYDROcodone (TUSSIONEX PENNKINETIC ER) 10-8 MG/5ML SUER; Take 5 mLs by mouth 2 (two) times  daily. -     valACYclovir (VALTREX) 500 MG tablet; TAKE ONE CAPLET BY MOUTH ONCE DAILY   I am having Ms. Holly Hays start on amoxicillin-clavulanate and chlorpheniramine-HYDROcodone. I am also having her maintain her clobetasol cream, doxycycline, triamcinolone cream, famotidine, levothyroxine, lisinopril-hydrochlorothiazide, metoprolol, and valACYclovir.  Meds ordered this encounter  Medications  . amoxicillin-clavulanate (AUGMENTIN) 875-125 MG per tablet    Sig: Take 1 tablet by mouth 2 (two) times daily.    Dispense:  20 tablet    Refill:  0  . chlorpheniramine-HYDROcodone (TUSSIONEX PENNKINETIC ER) 10-8 MG/5ML SUER    Sig: Take 5 mLs by mouth 2 (two) times daily.    Dispense:  60 mL    Refill:  0  . famotidine (PEPCID) 20 MG tablet    Sig: Take 1 tablet (20 mg total) by mouth 2 (two) times daily.    Dispense:  90 tablet  Refill:  1  . levothyroxine (SYNTHROID, LEVOTHROID) 100 MCG tablet    Sig: Take 1 tablet (100 mcg total) by mouth daily.    Dispense:  90 tablet    Refill:  1  . lisinopril-hydrochlorothiazide (PRINZIDE,ZESTORETIC) 20-12.5 MG per tablet    Sig: Take 1 tablet by mouth daily.    Dispense:  90 tablet    Refill:  1  . metoprolol (TOPROL XL) 200 MG 24 hr tablet    Sig: Take 1 tablet (200 mg total) by mouth daily.    Dispense:  90 tablet    Refill:  1  . valACYclovir (VALTREX) 500 MG tablet    Sig: TAKE ONE CAPLET BY MOUTH ONCE DAILY    Dispense:  90 tablet    Refill:  1    Appropriate red flag conditions were discussed with the patient as well as actions that should be taken.  Patient expressed his understanding.  Follow-up: Return if symptoms worsen or fail to improve.  Carmelina Dane, MD

## 2015-01-23 ENCOUNTER — Other Ambulatory Visit: Payer: Self-pay | Admitting: Family Medicine

## 2015-03-31 ENCOUNTER — Other Ambulatory Visit: Payer: Self-pay | Admitting: Family Medicine

## 2015-05-07 ENCOUNTER — Ambulatory Visit (INDEPENDENT_AMBULATORY_CARE_PROVIDER_SITE_OTHER): Payer: Federal, State, Local not specified - PPO | Admitting: Internal Medicine

## 2015-05-07 VITALS — BP 130/84 | HR 91 | Temp 98.2°F | Resp 18 | Ht 70.0 in | Wt 232.2 lb

## 2015-05-07 DIAGNOSIS — J0100 Acute maxillary sinusitis, unspecified: Secondary | ICD-10-CM

## 2015-05-07 DIAGNOSIS — I10 Essential (primary) hypertension: Secondary | ICD-10-CM | POA: Diagnosis not present

## 2015-05-07 DIAGNOSIS — E039 Hypothyroidism, unspecified: Secondary | ICD-10-CM | POA: Diagnosis not present

## 2015-05-07 MED ORDER — AMOXICILLIN 875 MG PO TABS: 875.0000 mg | ORAL_TABLET | Freq: Two times a day (BID) | ORAL | Status: DC

## 2015-05-07 MED ORDER — LEVOTHYROXINE SODIUM 100 MCG PO TABS: 100.0000 ug | ORAL_TABLET | Freq: Every day | ORAL | Status: DC

## 2015-05-07 MED ORDER — LISINOPRIL-HYDROCHLOROTHIAZIDE 20-12.5 MG PO TABS: 1.0000 | ORAL_TABLET | Freq: Every day | ORAL | Status: DC

## 2015-05-07 MED ORDER — METOPROLOL SUCCINATE ER 200 MG PO TB24: 200.0000 mg | ORAL_TABLET | Freq: Every day | ORAL | Status: DC

## 2015-05-07 NOTE — Progress Notes (Signed)
Subjective:  This chart was scribed for Holly Sia, MD by Stann Ore, Medical Scribe. This patient was seen in Room 10 and the patient's care was started at 11:38 AM.    Patient ID: Hartford Poli, female    DOB: 11-16-1965, 49 y.o.   MRN: [id number] Chief Complaint  Patient presents with  . Sore Throat  . Medication Refill    All medications    HPI IRHAA MAGNIN is a 49 y.o. female who presents to [facility] complaining of gradual onset sore throat that started 4 days ago. She's noticed loss of voice throughout the week and mostly returned yesterday. She has some nasal congestion with nose stuffed up, where she has to breathe through her mouth. She denies cough, fever, wheezing.   She also needs her BP medications refilled. Her BP are well controlled because she's been taking her BP medications regularly.   Patient Active Problem List   Diagnosis Date Noted  . Hypertension 08/06/2012  . Hypothyroidism 08/06/2012  . Thyroid goiter 08/06/2012    Current outpatient prescriptions:  .  doxycycline (VIBRA-TABS) 100 MG tablet, Take 1 tablet (100 mg total) by mouth 2 (two) times daily., Disp: 20 tablet, Rfl: 0 .  famotidine (PEPCID) 20 MG tablet, Take 1 tablet (20 mg total) by mouth 2 (two) times daily., Disp: 90 tablet, Rfl: 1 .  levothyroxine (SYNTHROID, LEVOTHROID) 100 MCG tablet, Take 1 tablet (100 mcg total) by mouth daily., Disp: 90 tablet, Rfl: 1 .  lisinopril-hydrochlorothiazide (PRINZIDE,ZESTORETIC) 20-12.5 MG per tablet, Take 1 tablet by mouth daily., Disp: 90 tablet, Rfl: 1 .  metoprolol (TOPROL XL) 200 MG 24 hr tablet, Take 1 tablet (200 mg total) by mouth daily., Disp: 90 tablet, Rfl: 1 .  valACYclovir (VALTREX) 500 MG tablet, TAKE ONE CAPLET BY MOUTH ONCE DAILY  "OFFICE VISIT NEEDED FOR REFILLS", Disp: 30 tablet, Rfl: 0 .  amoxicillin-clavulanate (AUGMENTIN) 875-125 MG per tablet, Take 1 tablet by mouth 2 (two) times daily. (Patient not taking: Reported on 05/07/2015),  Disp: 20 tablet, Rfl: 0 .  chlorpheniramine-HYDROcodone (TUSSIONEX PENNKINETIC ER) 10-8 MG/5ML SUER, Take 5 mLs by mouth 2 (two) times daily. (Patient not taking: Reported on 05/07/2015), Disp: 60 mL, Rfl: 0 .  clobetasol cream (TEMOVATE) 0.05 %, Apply topically 2 (two) times daily. (Patient not taking: Reported on 05/07/2015), Disp: 60 g, Rfl: 0 .  triamcinolone cream (KENALOG) 0.1 %, Apply 1 application topically 2 (two) times daily. (Patient not taking: Reported on 05/07/2015), Disp: 15 g, Rfl: 1  Review of Systems  Constitutional: Negative for fever, chills and fatigue.  HENT: Positive for congestion, postnasal drip, rhinorrhea, sinus pressure, sore throat and voice change.   Respiratory: Negative for cough and wheezing.   Gastrointestinal: Negative for nausea, vomiting and diarrhea.       Objective:   Physical Exam  Constitutional: She is oriented to person, place, and time. She appears well-developed and well-nourished. No distress.  HENT:  Head: Normocephalic and atraumatic.  Right Ear: Tympanic membrane normal.  Left Ear: Tympanic membrane normal.  Nares with purulent discharge, throat with purulent discharge  Eyes: EOM are normal. Pupils are equal, round, and reactive to light.  Neck: Neck supple.  symmetrical goiter without nodules  Cardiovascular: Normal rate.   Pulmonary/Chest: Effort normal. No respiratory distress.  Musculoskeletal: Normal range of motion.  Lymphadenopathy:    She has no cervical adenopathy.  Neurological: She is alert and oriented to person, place, and time.  Skin: Skin is warm and dry.  Psychiatric: She has a normal mood and affect. Her behavior is normal.  Nursing note and vitals reviewed.   BP 130/84 mmHg  Pulse 91  Temp(Src) 98.2 F (36.8 C) (Oral)  Resp 18  Ht 5\' 10"  (1.778 m)  Wt 232 lb 3.2 oz (105.325 kg)  BMI 33.32 kg/m2  SpO2 97%  LMP 04/28/2015     Assessment & Plan:  Acute maxillary sinusitis, recurrence not  specified  Hypothyroidism, unspecified hypothyroidism type - Plan: levothyroxine (SYNTHROID, LEVOTHROID) 100 MCG tablet  Essential hypertension - Plan: lisinopril-hydrochlorothiazide (PRINZIDE,ZESTORETIC) 20-12.5 MG tablet, metoprolol (TOPROL XL) 200 MG 24 hr tablet  While she has been faithful taking her medications since her last visit in June, she does need follow-up for full physical and for labs and I will schedule this for January after refilling her medicines until then.  Meds ordered this encounter  Medications  . amoxicillin (AMOXIL) 875 MG tablet    Sig: Take 1 tablet (875 mg total) by mouth 2 (two) times daily.    Dispense:  20 tablet    Refill:  0  . levothyroxine (SYNTHROID, LEVOTHROID) 100 MCG tablet    Sig: Take 1 tablet (100 mcg total) by mouth daily.    Dispense:  90 tablet    Refill:  0  . lisinopril-hydrochlorothiazide (PRINZIDE,ZESTORETIC) 20-12.5 MG tablet    Sig: Take 1 tablet by mouth daily.    Dispense:  90 tablet    Refill:  0  . metoprolol (TOPROL XL) 200 MG 24 hr tablet    Sig: Take 1 tablet (200 mg total) by mouth daily.    Dispense:  90 tablet    Refill:  0    By signing my name below, I, Stann Ore, attest that this documentation has been prepared under the direction and in the presence of Holly Sia, MD. Electronically Signed: Stann Ore, Scribe. 05/07/2015 , 11:38 AM .  I have completed the patient encounter in its entirety as documented by the scribe, with editing by me where necessary. Syrenity Klepacki P. Merla Riches, M.D.

## 2015-06-16 ENCOUNTER — Ambulatory Visit (INDEPENDENT_AMBULATORY_CARE_PROVIDER_SITE_OTHER): Payer: Federal, State, Local not specified - PPO | Admitting: Family Medicine

## 2015-06-16 VITALS — BP 170/104 | HR 79 | Temp 98.2°F | Resp 18 | Ht 70.0 in | Wt 234.4 lb

## 2015-06-16 DIAGNOSIS — A6000 Herpesviral infection of urogenital system, unspecified: Secondary | ICD-10-CM

## 2015-06-16 DIAGNOSIS — B029 Zoster without complications: Secondary | ICD-10-CM

## 2015-06-16 DIAGNOSIS — E039 Hypothyroidism, unspecified: Secondary | ICD-10-CM

## 2015-06-16 DIAGNOSIS — I10 Essential (primary) hypertension: Secondary | ICD-10-CM | POA: Diagnosis not present

## 2015-06-16 LAB — COMPLETE METABOLIC PANEL WITH GFR
ALBUMIN: 4.1 g/dL (ref 3.6–5.1)
ALK PHOS: 69 U/L (ref 33–115)
ALT: 17 U/L (ref 6–29)
AST: 18 U/L (ref 10–35)
BUN: 13 mg/dL (ref 7–25)
CALCIUM: 9.6 mg/dL (ref 8.6–10.2)
CO2: 26 mmol/L (ref 20–31)
CREATININE: 0.69 mg/dL (ref 0.50–1.10)
Chloride: 103 mmol/L (ref 98–110)
GFR, Est African American: >89 mL/min (ref >=60)
GFR, Est Non African American: >89 mL/min (ref >=60)
GLUCOSE: 86 mg/dL (ref 65–99)
Potassium: 4.1 mmol/L (ref 3.5–5.3)
Sodium: 137 mmol/L (ref 135–146)
TOTAL PROTEIN: 7.5 g/dL (ref 6.1–8.1)
Total Bilirubin: 0.6 mg/dL (ref 0.2–1.2)

## 2015-06-16 LAB — LIPID PANEL
CHOL/HDL RATIO: 3.9 ratio (ref <=5.0)
Cholesterol: 182 mg/dL (ref 125–200)
HDL: 47 mg/dL (ref >=46)
LDL CALC: 123 mg/dL (ref <130)
Triglycerides: 60 mg/dL (ref <150)
VLDL: 12 mg/dL (ref <30)

## 2015-06-16 MED ORDER — HYDROCODONE-ACETAMINOPHEN 5-325 MG PO TABS: 1.0000 | ORAL_TABLET | Freq: Four times a day (QID) | ORAL | Status: DC | PRN

## 2015-06-16 MED ORDER — LISINOPRIL-HYDROCHLOROTHIAZIDE 20-12.5 MG PO TABS: 1.0000 | ORAL_TABLET | Freq: Every day | ORAL | Status: DC

## 2015-06-16 MED ORDER — LEVOTHYROXINE SODIUM 100 MCG PO TABS: 100.0000 ug | ORAL_TABLET | Freq: Every day | ORAL | Status: DC

## 2015-06-16 MED ORDER — VALACYCLOVIR HCL 500 MG PO TABS: ORAL_TABLET | ORAL | Status: DC

## 2015-06-16 MED ORDER — METOPROLOL SUCCINATE ER 200 MG PO TB24: 200.0000 mg | ORAL_TABLET | Freq: Every day | ORAL | Status: DC

## 2015-06-16 NOTE — Patient Instructions (Addendum)
Keep a record of your blood pressures outside of the office and if remaining over 140/90, return within the next week to adjust your medications.    Increased Valtrex to 2 pills 3 times per day for the next 1 week. Tylenol is fine if he needed for pain, or if increased pain, can take the hydrocodone that was prescribed. If fevers, or spread of rash to other areas besides your arm or upper back, return for recheck. Cortisone over the counter if needed to itchy rash.   You should receive a call or letter about your lab results within the next week to 10 days.   Follow-up with me in the next 6 months for physical as planned.  Return to the clinic or go to the nearest emergency room if any of your symptoms worsen or new symptoms occur.  Shingles Shingles, which is also known as herpes zoster, is an infection that causes a painful skin rash and fluid-filled blisters. Shingles is not related to genital herpes, which is a sexually transmitted infection.   Shingles only develops in people who:  Have had chickenpox.  Have received the chickenpox vaccine. (This is rare.) CAUSES Shingles is caused by varicella-zoster virus (VZV). This is the same virus that causes chickenpox. After exposure to VZV, the virus stays in the body in an inactive (dormant) state. Shingles develops if the virus reactivates. This can happen many years after the initial exposure to VZV. It is not known what causes this virus to reactivate. RISK FACTORS People who have had chickenpox or received the chickenpox vaccine are at risk for shingles. Infection is more common in people who:  Are older than age 7.  Have a weakened defense (immune) system, such as those with HIV, AIDS, or cancer.  Are taking medicines that weaken the immune system, such as transplant medicines.  Are under great stress. SYMPTOMS Early symptoms of this condition include itching, tingling, and pain in an area on your skin. Pain may be described as  burning, stabbing, or throbbing. A few days or weeks after symptoms start, a painful red rash appears, usually on one side of the body in a bandlike or beltlike pattern. The rash eventually turns into fluid-filled blisters that break open, scab over, and dry up in about 2-3 weeks. At any time during the infection, you may also develop:  A fever.  Chills.  A headache.  An upset stomach. DIAGNOSIS This condition is diagnosed with a skin exam. Sometimes, skin or fluid samples are taken from the blisters before a diagnosis is made. These samples are examined under a microscope or sent to a lab for testing. TREATMENT There is no specific cure for this condition. Your health care provider will probably prescribe medicines to help you manage pain, recover more quickly, and avoid long-term problems. Medicines may include:  Antiviral drugs.  Anti-inflammatory drugs.  Pain medicines. If the area involved is on your face, you may be referred to a specialist, such as an eye doctor (ophthalmologist) or an ear, nose, and throat (ENT) doctor to help you avoid eye problems, chronic pain, or disability. HOME CARE INSTRUCTIONS Medicines  Take medicines only as directed by your health care provider.  Apply an anti-itch or numbing cream to the affected area as directed by your health care provider. Blister and Rash Care  Take a cool bath or apply cool compresses to the area of the rash or blisters as directed by your health care provider. This may help with pain and  itching.  Keep your rash covered with a loose bandage (dressing). Wear loose-fitting clothing to help ease the pain of material rubbing against the rash.  Keep your rash and blisters clean with mild soap and cool water or as directed by your health care provider.  Check your rash every day for signs of infection. These include redness, swelling, and pain that lasts or increases.  Do not pick your blisters.  Do not scratch your  rash. General Instructions  Rest as directed by your health care provider.  Keep all follow-up visits as directed by your health care provider. This is important.  Until your blisters scab over, your infection can cause chickenpox in people who have never had it or been vaccinated against it. To prevent this from happening, avoid contact with other people, especially:  Babies.  Pregnant women.  Children who have eczema.  Elderly people who have transplants.  People who have chronic illnesses, such as leukemia or AIDS. SEEK MEDICAL CARE IF:  Your pain is not relieved with prescribed medicines.  Your pain does not get better after the rash heals.  Your rash looks infected. Signs of infection include redness, swelling, and pain that lasts or increases. SEEK IMMEDIATE MEDICAL CARE IF:  The rash is on your face or nose.  You have facial pain, pain around your eye area, or loss of feeling on one side of your face.  You have ear pain or you have ringing in your ear.  You have loss of taste.  Your condition gets worse.   This information is not intended to replace advice given to you by your health care provider. Make sure you discuss any questions you have with your health care provider.   Document Released: 05/14/2005 Document Revised: 06/04/2014 Document Reviewed: 03/25/2014 Elsevier Interactive Patient Education [employer]

## 2015-06-16 NOTE — Progress Notes (Addendum)
Subjective:    Patient ID: Holly Hays, female    DOB: 1965/07/05, 50 y.o.   MRN: [id number] By signing my name below, I, Littie Deeds, attest that this documentation has been prepared under the direction and in the presence of [name], MD.  Electronically Signed: Littie Deeds, Medical Scribe. 06/16/2015. 1:36 PM.  HPI HPI Comments: Holly Hays is a 50 y.o. female who presents to the [facility] for multiple concerns. Patient is fasting; she last ate around 3:00 AM this morning.  Hypertension: Last seen by Dr. Merla Riches in December. At that time, blood pressure was controlled, 134/84. Planned on full physical with lab work this month. Was given a 3 month prescription of meds. She did take her blood pressure medications today and has not missed any doses. She notes that her blood pressure is normally 120s-130s/70s-80s at home. Patient denies chest pain, SOB, lightheadedness, visual changes, and weakness. Lab Results  Component Value Date   CREATININE 0.86 07/05/2013    Hypothyroidism: She takes Synthroid 100 mcg qd. Lab Results  Component Value Date   TSH 0.431 07/05/2013    GERD/Reflux: Pepcid use in the past.   Genital HSV: She takes Valtrex 500 mg qd and needs a refill. Patient has not had any outbreaks of genital herpes in over a year.  Rash: Patient complains of a painful, burning, pruritic rash to her left upper arm that started 3 days ago. She had difficulty sleeping last night due to the pain. She has taken hydrocodone in the past.  She has a 72-year-old granddaughter.  Patient Active Problem List   Diagnosis Date Noted  . Hypertension 08/06/2012  . Hypothyroidism 08/06/2012  . Thyroid goiter 08/06/2012   Past Medical History  Diagnosis Date  . Hypertension   . Thyroid disease    Past Surgical History  Procedure Laterality Date  . Breast reduction     Allergies  Allergen Reactions  . Latex Itching and Rash   Prior to Admission  medications   Medication Sig Start Date End Date Taking? Authorizing Provider  levothyroxine (SYNTHROID, LEVOTHROID) 100 MCG tablet Take 1 tablet (100 mcg total) by mouth daily. 05/07/15  Yes Tonye Pearson, MD  lisinopril-hydrochlorothiazide (PRINZIDE,ZESTORETIC) 20-12.5 MG tablet Take 1 tablet by mouth daily. 05/07/15  Yes Tonye Pearson, MD  metoprolol (TOPROL XL) 200 MG 24 hr tablet Take 1 tablet (200 mg total) by mouth daily. 05/07/15  Yes Tonye Pearson, MD  chlorpheniramine-HYDROcodone [facility]) 10-8 MG/5ML SUER Take 5 mLs by mouth 2 (two) times daily. Patient not taking: Reported on 05/07/2015 11/11/14   Carmelina Dane, MD  clobetasol cream (TEMOVATE) 0.05 % Apply topically 2 (two) times daily. Patient not taking: Reported on 05/07/2015 08/05/12   Thao P Le, DO  famotidine (PEPCID) 20 MG tablet Take 1 tablet (20 mg total) by mouth 2 (two) times daily. 11/11/14   Carmelina Dane, MD  triamcinolone cream (KENALOG) 0.1 % Apply 1 application topically 2 (two) times daily. Patient not taking: Reported on 05/07/2015 04/15/14   Carmelina Dane, MD  valACYclovir (VALTREX) 500 MG tablet TAKE ONE CAPLET BY MOUTH ONCE DAILY  "OFFICE VISIT NEEDED FOR REFILLS" Patient not taking: Reported on 06/16/2015 04/01/15   Shade Flood, MD   Social History   Social History  . Marital Status: Married    Spouse Name: N/A  . Number of Children: N/A  . Years of Education: N/A   Occupational History  .  Not on file.   Social History Main Topics  . Smoking status: Never Smoker   . Smokeless tobacco: Not on file  . Alcohol Use: No  . Drug Use: No  . Sexual Activity: Yes   Other Topics Concern  . Not on file   Social History Narrative     Review of Systems  Constitutional: Negative for fatigue and unexpected weight change.  Respiratory: Negative for chest tightness and shortness of breath.   Cardiovascular: Negative for chest pain, palpitations and leg  swelling.  Gastrointestinal: Negative for abdominal pain and blood in stool.  Skin: Positive for rash.  Neurological: Negative for dizziness, syncope, light-headedness and headaches.       Objective:   Physical Exam  Constitutional: She is oriented to person, place, and time. She appears well-developed and well-nourished.  HENT:  Head: Normocephalic and atraumatic.  Eyes: Conjunctivae and EOM are normal. Pupils are equal, round, and reactive to light.  Neck: Carotid bruit is not present.  Cardiovascular: Normal rate, regular rhythm, normal heart sounds and intact distal pulses.   Pulmonary/Chest: Effort normal and breath sounds normal.  Abdominal: Soft. She exhibits no pulsatile midline mass. There is no tenderness.  Neurological: She is alert and oriented to person, place, and time.  Skin: Skin is warm and dry. Rash noted.  Left upper outer arm, there is 5 cm x 4 cm patch, slight erythema with multiple small vesicular/papular lesions. Tenderness and sensitivity along the C8 dermatone throughout the mid back, does not cross midline. No rash on the back.   Psychiatric: She has a normal mood and affect. Her behavior is normal.  Vitals reviewed.   Filed Vitals:   06/16/15 1216 06/16/15 1217  BP: 177/97 170/104  Pulse: 79   Temp: 98.2 F (36.8 C)   TempSrc: Oral   Resp: 18   Height:  (1.778 m)   Weight: 234 lb 6.4 oz (106.323 kg)   SpO2: 97%          Assessment & Plan:   Holly Hays is a 50 y.o. female Essential hypertension - Plan: metoprolol (TOPROL XL) 200 MG 24 hr tablet, lisinopril-hydrochlorothiazide (PRINZIDE,ZESTORETIC) 20-12.5 MG tablet, COMPLETE METABOLIC PANEL WITH GFR, Lipid panel  -  Uncontrolled in office here today, but home readings have been  Normal. Also normal blood pressure last visit in December. May be a component of pain versus white coat hypertension here in the office today. Check home blood pressure readings and if remaining over 140/90 this  week, return for recheck and adjustment of meds.   Hypothyroidism, unspecified hypothyroidism type - Plan: levothyroxine (SYNTHROID, LEVOTHROID) 100 MCG tablet, TSH  -  Check TSH, continue same dose of Synthroid for now.  Shingles rash - Plan: valACYclovir (VALTREX) 500 MG tablet, HYDROcodone-acetaminophen (NORCO/VICODIN) 5-325 MG tablet  - Suspected shingles, early onset with single patch, but hyperesthesia/dysesthesia across C8 dermatome.  No known source for contact dermatitis.  -  Increase Valtrex to 1 g 3 times a day for 1 week, then return to 500 mg dose for HSV suppression.  - Tylenol as needed over-the-counter with her elevated blood pressure today, or Lortab if needed for increased pain. #20 prescribed. Side effects discussed. RTC precautions.  Genital HSV - Plan: valACYclovir (VALTREX) 500 MG tablet  - Controlled with suppression on 500 mg daily Valtrex.   Follow-up in the next 3-6 months for physical.  Meds ordered this encounter  Medications  . valACYclovir (VALTREX) 500 MG tablet    Sig:  TAKE ONE CAPLET BY MOUTH ONCE DAILY    Dispense:  90 tablet    Refill:  3  . metoprolol (TOPROL XL) 200 MG 24 hr tablet    Sig: Take 1 tablet (200 mg total) by mouth daily.    Dispense:  90 tablet    Refill:  1  . levothyroxine (SYNTHROID, LEVOTHROID) 100 MCG tablet    Sig: Take 1 tablet (100 mcg total) by mouth daily.    Dispense:  90 tablet    Refill:  1  . lisinopril-hydrochlorothiazide (PRINZIDE,ZESTORETIC) 20-12.5 MG tablet    Sig: Take 1 tablet by mouth daily.    Dispense:  90 tablet    Refill:  1  . HYDROcodone-acetaminophen (NORCO/VICODIN) 5-325 MG tablet    Sig: Take 1 tablet by mouth every 6 (six) hours as needed for moderate pain.    Dispense:  20 tablet    Refill:  0   Patient Instructions  Keep a record of your blood pressures outside of the office and if remaining over 140/90, return within the next week to adjust your medications.    Increased Valtrex to 2 pills 3  times per day for the next 1 week. Tylenol is fine if he needed for pain, or if increased pain, can take the hydrocodone that was prescribed. If fevers, or spread of rash to other areas besides your arm or upper back, return for recheck. Cortisone over the counter if needed to itchy rash.   You should receive a call or letter about your lab results within the next week to 10 days.   Follow-up with me in the next 6 months for physical as planned.  Return to the clinic or go to the nearest emergency room if any of your symptoms worsen or new symptoms occur.  Shingles Shingles, which is also known as herpes zoster, is an infection that causes a painful skin rash and fluid-filled blisters. Shingles is not related to genital herpes, which is a sexually transmitted infection.   Shingles only develops in people who:  Have had chickenpox.  Have received the chickenpox vaccine. (This is rare.) CAUSES Shingles is caused by varicella-zoster virus (VZV). This is the same virus that causes chickenpox. After exposure to VZV, the virus stays in the body in an inactive (dormant) state. Shingles develops if the virus reactivates. This can happen many years after the initial exposure to VZV. It is not known what causes this virus to reactivate. RISK FACTORS People who have had chickenpox or received the chickenpox vaccine are at risk for shingles. Infection is more common in people who:  Are older than age 93.  Have a weakened defense (immune) system, such as those with HIV, AIDS, or cancer.  Are taking medicines that weaken the immune system, such as transplant medicines.  Are under great stress. SYMPTOMS Early symptoms of this condition include itching, tingling, and pain in an area on your skin. Pain may be described as burning, stabbing, or throbbing. A few days or weeks after symptoms start, a painful red rash appears, usually on one side of the body in a bandlike or beltlike pattern. The rash  eventually turns into fluid-filled blisters that break open, scab over, and dry up in about 2-3 weeks. At any time during the infection, you may also develop:  A fever.  Chills.  A headache.  An upset stomach. DIAGNOSIS This condition is diagnosed with a skin exam. Sometimes, skin or fluid samples are taken from the blisters before a  diagnosis is made. These samples are examined under a microscope or sent to a lab for testing. TREATMENT There is no specific cure for this condition. Your health care provider will probably prescribe medicines to help you manage pain, recover more quickly, and avoid long-term problems. Medicines may include:  Antiviral drugs.  Anti-inflammatory drugs.  Pain medicines. If the area involved is on your face, you may be referred to a specialist, such as an eye doctor (ophthalmologist) or an ear, nose, and throat (ENT) doctor to help you avoid eye problems, chronic pain, or disability. HOME CARE INSTRUCTIONS Medicines  Take medicines only as directed by your health care provider.  Apply an anti-itch or numbing cream to the affected area as directed by your health care provider. Blister and Rash Care  Take a cool bath or apply cool compresses to the area of the rash or blisters as directed by your health care provider. This may help with pain and itching.  Keep your rash covered with a loose bandage (dressing). Wear loose-fitting clothing to help ease the pain of material rubbing against the rash.  Keep your rash and blisters clean with mild soap and cool water or as directed by your health care provider.  Check your rash every day for signs of infection. These include redness, swelling, and pain that lasts or increases.  Do not pick your blisters.  Do not scratch your rash. General Instructions  Rest as directed by your health care provider.  Keep all follow-up visits as directed by your health care provider. This is important.  Until your  blisters scab over, your infection can cause chickenpox in people who have never had it or been vaccinated against it. To prevent this from happening, avoid contact with other people, especially:  Babies.  Pregnant women.  Children who have eczema.  Elderly people who have transplants.  People who have chronic illnesses, such as leukemia or AIDS. SEEK MEDICAL CARE IF:  Your pain is not relieved with prescribed medicines.  Your pain does not get better after the rash heals.  Your rash looks infected. Signs of infection include redness, swelling, and pain that lasts or increases. SEEK IMMEDIATE MEDICAL CARE IF:  The rash is on your face or nose.  You have facial pain, pain around your eye area, or loss of feeling on one side of your face.  You have ear pain or you have ringing in your ear.  You have loss of taste.  Your condition gets worse.   This information is not intended to replace advice given to you by your health care provider. Make sure you discuss any questions you have with your health care provider.   Document Released: 05/14/2005 Document Revised: 06/04/2014 Document Reviewed: 03/25/2014 Elsevier Interactive Patient Education [employer]     I personally performed the services described in this documentation, which was scribed in my presence. The recorded information has been reviewed and considered, and addended by me as needed.

## 2015-06-17 LAB — TSH: TSH: 0.802 u[IU]/mL (ref 0.350–4.500)

## 2015-06-20 ENCOUNTER — Telehealth: Payer: Self-pay

## 2015-06-20 DIAGNOSIS — B029 Zoster without complications: Secondary | ICD-10-CM

## 2015-06-20 NOTE — Telephone Encounter (Signed)
Pt is needing a work note to be out all week and returning on Monday please call [phone]  Pt was last seen on 06/16/15

## 2015-06-20 NOTE — Telephone Encounter (Signed)
Seen for shingles rash, but is this keeping her from RTW?  More info please.

## 2015-06-20 NOTE — Telephone Encounter (Signed)
Ok for all week?

## 2015-06-21 NOTE — Telephone Encounter (Signed)
He is not sure if the pain medication is working but she is taking it every 6 hours. The rash is still red and really sensitive. He gently touched it and caused her a lot of pain. Do want pt to RTC?

## 2015-06-21 NOTE — Telephone Encounter (Signed)
Spoke with pt's husband, she is in a lot of pain. She is crying and her husband thinks she should not go to work.

## 2015-06-21 NOTE — Telephone Encounter (Signed)
Can increase pain medication to two at a time if needed and out of work note for this week ok. Return if this pain regimen does not manage the pain.

## 2015-06-21 NOTE — Telephone Encounter (Signed)
Left message for pt to call back  °

## 2015-06-22 ENCOUNTER — Encounter: Payer: Self-pay | Admitting: *Deleted

## 2015-06-22 MED ORDER — HYDROCODONE-ACETAMINOPHEN 5-325 MG PO TABS: 1.0000 | ORAL_TABLET | Freq: Four times a day (QID) | ORAL | Status: DC | PRN

## 2015-06-22 NOTE — Telephone Encounter (Signed)
Refilled and ready for pickup.  

## 2015-06-22 NOTE — Telephone Encounter (Signed)
Pt husband notified that rx is ready for pickup

## 2015-06-22 NOTE — Telephone Encounter (Signed)
Spoke with pt's husband and advised letter is ready to pick up. He would like to see if Dr. Neva Seat can refill the pain medication because she will run out soon if she takes 2 at a time. Please advise.

## 2015-07-03 ENCOUNTER — Encounter: Payer: Self-pay | Admitting: *Deleted

## 2015-08-09 ENCOUNTER — Other Ambulatory Visit: Payer: Self-pay | Admitting: Family Medicine

## 2015-08-10 ENCOUNTER — Encounter: Payer: Federal, State, Local not specified - PPO | Admitting: Family Medicine

## 2015-09-21 ENCOUNTER — Ambulatory Visit (INDEPENDENT_AMBULATORY_CARE_PROVIDER_SITE_OTHER): Payer: Federal, State, Local not specified - PPO | Admitting: Family Medicine

## 2015-09-21 ENCOUNTER — Encounter: Payer: Self-pay | Admitting: Family Medicine

## 2015-09-21 VITALS — BP 150/110 | HR 63 | Temp 98.4°F | Resp 16 | Ht 67.5 in | Wt 220.0 lb

## 2015-09-21 DIAGNOSIS — E039 Hypothyroidism, unspecified: Secondary | ICD-10-CM

## 2015-09-21 DIAGNOSIS — Z23 Encounter for immunization: Secondary | ICD-10-CM | POA: Diagnosis not present

## 2015-09-21 DIAGNOSIS — Z0000 Encounter for general adult medical examination without abnormal findings: Secondary | ICD-10-CM | POA: Diagnosis not present

## 2015-09-21 DIAGNOSIS — A6000 Herpesviral infection of urogenital system, unspecified: Secondary | ICD-10-CM | POA: Diagnosis not present

## 2015-09-21 DIAGNOSIS — I10 Essential (primary) hypertension: Secondary | ICD-10-CM | POA: Diagnosis not present

## 2015-09-21 LAB — POCT URINALYSIS DIP (MANUAL ENTRY)
BILIRUBIN UA: NEGATIVE
GLUCOSE UA: NEGATIVE
Ketones, POC UA: NEGATIVE
NITRITE UA: NEGATIVE
Protein Ur, POC: NEGATIVE
Spec Grav, UA: 1.02
UROBILINOGEN UA: 0.2
pH, UA: 5

## 2015-09-21 MED ORDER — LISINOPRIL-HYDROCHLOROTHIAZIDE 20-25 MG PO TABS: 1.0000 | ORAL_TABLET | Freq: Every day | ORAL | Status: DC

## 2015-09-21 MED ORDER — LEVOTHYROXINE SODIUM 100 MCG PO TABS: 100.0000 ug | ORAL_TABLET | Freq: Every day | ORAL | Status: DC

## 2015-09-21 MED ORDER — METOPROLOL SUCCINATE ER 200 MG PO TB24: 200.0000 mg | ORAL_TABLET | Freq: Every day | ORAL | Status: DC

## 2015-09-21 NOTE — Progress Notes (Addendum)
Subjective:  By signing my name below, I, Stann Ore, attest that this documentation has been prepared under the direction and in the presence of [name], MD. Electronically Signed: Stann Ore, Scribe. 09/21/2015 , 9:20 AM .  Patient was seen in Room 25 .   Patient ID: Holly Hays, female    DOB: 1965-07-25, 50 y.o.   MRN: [id number] Chief Complaint  Patient presents with  . Annual Exam    w/pap   HPI Holly Hays is a 50 y.o. female Here for annual physical. H/o HTN, hypothyroidism, genital HSV, and GERD.   Seasonal Allergies She's been taking allegra for relief of her seasonal allergy symptoms.   HTN Uncontrolled last visit, but home readings were normal. Suspected "white coat component" and pain with shingles rash at that time. Advised to check home readings. Continued on same dose of medications.   Lab Results  Component Value Date   CREATININE 0.69 06/16/2015   She's been having similar high BP readings at home. She denies missing any doses of her medications. She denies eating fast food and also lowered her sodium intake. She denies chest pain, headaches, lightheadedness or shortness of breath.   Hypothyroidism Lab Results  Component Value Date   TSH 0.802 06/16/2015   She is on synthroid qd.   Wt Readings from Last 3 Encounters:  09/21/15 220 lb (99.791 kg)  06/16/15 234 lb 6.4 oz (106.323 kg)  05/07/15 232 lb 3.2 oz (105.325 kg)   GERD She takes pepcid as needed, about once a week.   Genital HSV She takes valtrex 500mg  qd. She denies any outbreaks recently.   Cancer Screening Last mammogram done about a year ago. She's had cyst removed in 2009, normal mammogram since then; no reports of cancer or pre-cancer.  Cervical cancer testing: previously followed by Arlyce Harman, MD. Last pap testing done about 2 years ago, and reports normal.  No family history of colon cancer.   Immunizations Immunization History  Administered  Date(s) Administered  . PPD Test 10/14/2011  . Tdap 09/21/2015    Depression Depression screen [facility] 2/9 09/21/2015 06/16/2015 05/07/2015 11/11/2014 11/23/2013  Decreased Interest 0 0 0 0 0  Down, Depressed, Hopeless 0 0 0 0 0  PHQ - 2 Score 0 0 0 0 0    Vision  Visual Acuity Screening   Right eye Left eye Both eyes  Without correction:     With correction: 20/30 20/20 20/20    She has an eye doctor appointment in 1 week.   Dentist She saw her dentist yesterday and had braces put in.   Exercise She's been increasing her exercise since last visit.   Lipid panel Lab Results  Component Value Date   CHOL 182 06/16/2015   HDL 47 06/16/2015   LDLCALC 123 06/16/2015   TRIG 60 06/16/2015   CHOLHDL 3.9 06/16/2015   Lab Results  Component Value Date   ALT 17 06/16/2015   AST 18 06/16/2015   ALKPHOS 69 06/16/2015   BILITOT 0.6 06/16/2015    Patient Active Problem List   Diagnosis Date Noted  . Hypertension 08/06/2012  . Hypothyroidism 08/06/2012  . Thyroid goiter 08/06/2012   Past Medical History  Diagnosis Date  . Hypertension   . Thyroid disease   . Allergy    Past Surgical History  Procedure Laterality Date  . Breast reduction     Allergies  Allergen Reactions  . Latex Itching and Rash   Prior to Admission  medications   Medication Sig Start Date End Date Taking? Authorizing Provider  famotidine (PEPCID) 20 MG tablet TAKE ONE TABLET BY MOUTH TWICE DAILY 08/12/15   Shade Flood, MD  HYDROcodone-acetaminophen (NORCO/VICODIN) 5-325 MG tablet Take 1 tablet by mouth every 6 (six) hours as needed for moderate pain. 06/22/15   Shade Flood, MD  levothyroxine (SYNTHROID, LEVOTHROID) 100 MCG tablet Take 1 tablet (100 mcg total) by mouth daily. 06/16/15   Shade Flood, MD  lisinopril-hydrochlorothiazide (PRINZIDE,ZESTORETIC) 20-12.5 MG tablet Take 1 tablet by mouth daily. 06/16/15   Shade Flood, MD  metoprolol (TOPROL XL) 200 MG 24 hr tablet Take 1 tablet (200  mg total) by mouth daily. 06/16/15   Shade Flood, MD  valACYclovir (VALTREX) 500 MG tablet TAKE ONE CAPLET BY MOUTH ONCE DAILY 06/16/15   Shade Flood, MD   Social History   Social History  . Marital Status: Married    Spouse Name: N/A  . Number of Children: N/A  . Years of Education: N/A   Occupational History  . Not on file.   Social History Main Topics  . Smoking status: Never Smoker   . Smokeless tobacco: Not on file  . Alcohol Use: No  . Drug Use: No  . Sexual Activity: Yes   Other Topics Concern  . Not on file   Social History Narrative   Married   Education: Automotive engineer   Exercise: Walking   Review of Systems 13 point ROS - positive for ear pain, rhinorrhea, sore throat, eye itching; otherwise negative      Objective:   Physical Exam  Constitutional: She is oriented to person, place, and time. She appears well-developed and well-nourished.  HENT:  Head: Normocephalic and atraumatic.  Right Ear: External ear normal.  Left Ear: External ear normal.  Mouth/Throat: Oropharynx is clear and moist.  Eyes: Conjunctivae are normal. Pupils are equal, round, and reactive to light.  Neck: Normal range of motion. Neck supple. Thyromegaly present.  Thyroid fullness bilaterally without nodules  Cardiovascular: Normal rate, regular rhythm, normal heart sounds and intact distal pulses.   No murmur heard. Pulmonary/Chest: Effort normal and breath sounds normal. No respiratory distress. She has no wheezes.  Abdominal: Soft. Bowel sounds are normal. There is no tenderness.  Musculoskeletal: Normal range of motion. She exhibits no edema or tenderness.  Lymphadenopathy:    She has no cervical adenopathy.  Neurological: She is alert and oriented to person, place, and time.  Skin: Skin is warm and dry. No rash noted.  Psychiatric: She has a normal mood and affect. Her behavior is normal. Thought content normal.  Vitals reviewed.    Filed Vitals:   09/21/15 0819 09/21/15  0829  BP: 160/102 150/110  Pulse: 63   Temp: 98.4 F (36.9 C)   TempSrc: Oral   Resp: 16   Height: 5' 7.5" (1.715 m)   Weight: 220 lb (99.791 kg)   SpO2: 99%       Assessment & Plan:   Holly Hays is a 50 y.o. female Annual physical exam  --anticipatory guidance as below in AVS, screening labs above. Health maintenance items as above in HPI discussed/recommended as applicable.   Need for Tdap vaccination - Plan: Tdap vaccine greater than or equal to 7yo IM given.     Essential hypertension - Plan: metoprolol (TOPROL XL) 200 MG 24 hr tablet, POCT urinalysis dipstick  - Increase HCTZ component to 25 mg, continue other doses of medicines. Check home  readings, RTC precautions if persistently elevated.  Hypothyroidism, unspecified hypothyroidism type - Plan: levothyroxine (SYNTHROID, LEVOTHROID) 100 MCG tablet  -Continue same dose synthroid.   Genital HSV  - Continue Valtrex 500 mg daily for suppression.   Meds ordered this encounter  Medications  . DISCONTD: lisinopril-hydrochlorothiazide (PRINZIDE,ZESTORETIC) 20-25 MG tablet    Sig: Take 1 tablet by mouth daily.    Dispense:  90 tablet    Refill:  1  . metoprolol (TOPROL XL) 200 MG 24 hr tablet    Sig: Take 1 tablet (200 mg total) by mouth daily.    Dispense:  90 tablet    Refill:  1  . levothyroxine (SYNTHROID, LEVOTHROID) 100 MCG tablet    Sig: Take 1 tablet (100 mcg total) by mouth daily.    Dispense:  90 tablet    Refill:  1  . lisinopril-hydrochlorothiazide (PRINZIDE,ZESTORETIC) 20-25 MG tablet    Sig: Take 1 tablet by mouth daily.    Dispense:  90 tablet    Refill:  1    Reordered as accidentally discontinued (already sent to pharmacy)   Patient Instructions       IF you received an x-ray today, you will receive an invoice from [facility]. Please contact [facility] at [phone] with questions or concerns regarding your invoice.   IF you received labwork today, you will receive an  invoice from [employer]. Please contact [facility] at [phone] with questions or concerns regarding your invoice.   Our billing staff will not be able to assist you with questions regarding bills from these companies.  You will be contacted with the lab results as soon as they are available. The fastest way to get your results is to activate your My Chart account. Instructions are located on the last page of this paperwork. If you have not heard from [place] regarding the results in 2 weeks, please contact this office.   For allergies - continue allegra or zyrtec, add flonase if needed.  I will increase dose of one of your blood pressure meds, but return for recehck in next 6 weeks. Also see dash diet below to help manage blood pressure.   DASH Eating Plan DASH stands for "Dietary Approaches to Stop Hypertension." The DASH eating plan is a healthy eating plan that has been shown to reduce high blood pressure (hypertension). Additional health benefits may include reducing the risk of type 2 diabetes mellitus, heart disease, and stroke. The DASH eating plan may also help with weight loss. WHAT DO I NEED TO KNOW ABOUT THE DASH EATING PLAN? For the DASH eating plan, you will follow these general guidelines:  Choose foods with a percent daily value for sodium of less than 5% (as listed on the food label).  Use salt-free seasonings or herbs instead of table salt or sea salt.  Check with your health care provider or pharmacist before using salt substitutes.  Eat lower-sodium products, often labeled as "lower sodium" or "no salt added."  Eat fresh foods.  Eat more vegetables, fruits, and low-fat dairy products.  Choose whole grains. Look for the word "whole" as the first word in the ingredient list.  Choose fish and skinless chicken or [place] more often than red meat. Limit fish, poultry, and meat to 6 oz (170 g) each day.  Limit sweets, desserts, sugars, and sugary  drinks.  Choose heart-healthy fats.  Limit cheese to 1 oz (28 g) per day.  Eat more home-cooked food and less restaurant, buffet, and  fast food.  Limit fried foods.  Cook foods using methods other than frying.  Limit canned vegetables. If you do use them, rinse them well to decrease the sodium.  When eating at a restaurant, ask that your food be prepared with less salt, or no salt if possible. WHAT FOODS CAN I EAT? Seek help from a dietitian for individual calorie needs. Grains Whole grain or whole wheat bread. Brown rice. Whole grain or whole wheat pasta. Quinoa, bulgur, and whole grain cereals. Low-sodium cereals. Corn or whole wheat flour tortillas. Whole grain cornbread. Whole grain crackers. Low-sodium crackers. Vegetables Fresh or frozen vegetables (raw, steamed, roasted, or grilled). Low-sodium or reduced-sodium tomato and vegetable juices. Low-sodium or reduced-sodium tomato sauce and paste. Low-sodium or reduced-sodium canned vegetables.  Fruits All fresh, canned (in natural juice), or frozen fruits. Meat and Other Protein Products Ground beef (85% or leaner), grass-fed beef, or beef trimmed of fat. Skinless chicken or [place]. Ground chicken or [place]. Pork trimmed of fat. All fish and seafood. Eggs. Dried beans, peas, or lentils. Unsalted nuts and seeds. Unsalted canned beans. Dairy Low-fat dairy products, such as skim or 1% milk, 2% or reduced-fat cheeses, low-fat ricotta or cottage cheese, or plain low-fat yogurt. Low-sodium or reduced-sodium cheeses. Fats and Oils Tub margarines without trans fats. Light or reduced-fat mayonnaise and salad dressings (reduced sodium). Avocado. Safflower, olive, or canola oils. Natural peanut or almond butter. Other Unsalted popcorn and pretzels. The items listed above may not be a complete list of recommended foods or beverages. Contact your dietitian for more options. WHAT FOODS ARE NOT RECOMMENDED? Grains White bread. White pasta.  White rice. Refined cornbread. Bagels and croissants. Crackers that contain trans fat. Vegetables Creamed or fried vegetables. Vegetables in a cheese sauce. Regular canned vegetables. Regular canned tomato sauce and paste. Regular tomato and vegetable juices. Fruits Dried fruits. Canned fruit in light or heavy syrup. Fruit juice. Meat and Other Protein Products Fatty cuts of meat. Ribs, chicken wings, bacon, sausage, bologna, salami, chitterlings, fatback, hot dogs, bratwurst, and packaged luncheon meats. Salted nuts and seeds. Canned beans with salt. Dairy Whole or 2% milk, cream, half-and-half, and cream cheese. Whole-fat or sweetened yogurt. Full-fat cheeses or blue cheese. Nondairy creamers and whipped toppings. Processed cheese, cheese spreads, or cheese curds. Condiments Onion and garlic salt, seasoned salt, table salt, and sea salt. Canned and packaged gravies. Worcestershire sauce. Tartar sauce. Barbecue sauce. Teriyaki sauce. Soy sauce, including reduced sodium. Steak sauce. Fish sauce. Oyster sauce. Cocktail sauce. Horseradish. Ketchup and mustard. Meat flavorings and tenderizers. Bouillon cubes. Hot sauce. Tabasco sauce. Marinades. Taco seasonings. Relishes. Fats and Oils Butter, stick margarine, lard, shortening, ghee, and bacon fat. Coconut, palm kernel, or palm oils. Regular salad dressings. Other Pickles and olives. Salted popcorn and pretzels. The items listed above may not be a complete list of foods and beverages to avoid. Contact your dietitian for more information. WHERE CAN I FIND MORE INFORMATION? National Heart, Lung, and Blood Institute: [employer]Prom[employer].it   This information is not intended to replace advice given to you by your health care provider. Make sure you discuss any questions you have with your health care provider.   Document Released: 05/03/2011 Document Revised: 06/04/2014 Document Reviewed: 03/18/2013 Elsevier Interactive  Patient Education 2016 [employer].   Allergic Rhinitis Allergic rhinitis is when the mucous membranes in the nose respond to allergens. Allergens are particles in the air that cause your body to have an allergic reaction. This causes you to release allergic antibodies. Through  a chain of events, these eventually cause you to release histamine into the blood stream. Although meant to protect the body, it is this release of histamine that causes your discomfort, such as frequent sneezing, congestion, and an itchy, runny nose.  CAUSES Seasonal allergic rhinitis (hay fever) is caused by pollen allergens that may come from grasses, trees, and weeds. Year-round allergic rhinitis (perennial allergic rhinitis) is caused by allergens such as house dust mites, pet dander, and mold spores. SYMPTOMS  Nasal stuffiness (congestion).  Itchy, runny nose with sneezing and tearing of the eyes. DIAGNOSIS Your health care provider can help you determine the allergen or allergens that trigger your symptoms. If you and your health care provider are unable to determine the allergen, skin or blood testing may be used. Your health care provider will diagnose your condition after taking your health history and performing a physical exam. Your health care provider may assess you for other related conditions, such as asthma, pink eye, or an ear infection. TREATMENT Allergic rhinitis does not have a cure, but it can be controlled by:  Medicines that block allergy symptoms. These may include allergy shots, nasal sprays, and oral antihistamines.  Avoiding the allergen. Hay fever may often be treated with antihistamines in pill or nasal spray forms. Antihistamines block the effects of histamine. There are over-the-counter medicines that may help with nasal congestion and swelling around the eyes. Check with your health care provider before taking or giving this medicine. If avoiding the allergen or the medicine prescribed  do not work, there are many new medicines your health care provider can prescribe. Stronger medicine may be used if initial measures are ineffective. Desensitizing injections can be used if medicine and avoidance does not work. Desensitization is when a patient is given ongoing shots until the body becomes less sensitive to the allergen. Make sure you follow up with your health care provider if problems continue. HOME CARE INSTRUCTIONS It is not possible to completely avoid allergens, but you can reduce your symptoms by taking steps to limit your exposure to them. It helps to know exactly what you are allergic to so that you can avoid your specific triggers. SEEK MEDICAL CARE IF:  You have a fever.  You develop a cough that does not stop easily (persistent).  You have shortness of breath.  You start wheezing.  Symptoms interfere with normal daily activities.   This information is not intended to replace advice given to you by your health care provider. Make sure you discuss any questions you have with your health care provider.   Document Released: 02/06/2001 Document Revised: 06/04/2014 Document Reviewed: 01/19/2013 Elsevier Interactive Patient Education 2016 [employer].     Keeping You Healthy  Get These Tests  Blood Pressure- Have your blood pressure checked once a year by your health care provider.  Normal blood pressure is 120/80.  Weight- Have your body mass index (BMI) calculated to screen for obesity.  BMI is measure of body fat based on height and weight.  You can also calculate your own BMI at https://www.west-esparza.com/.  Cholesterol- Have your cholesterol checked every 5 years starting at age 78 then yearly starting at age 41.  Chlamydia, HIV, and other sexually transmitted diseases- Get screened every year until age 50, then within three months of each new sexual provider.  Pap Test - Every 1-5 years; discuss with your health care provider.  Mammogram- Every 1-2  years starting at age 26--50  Take these medicines  Calcium with  Vitamin D-Your body needs 1200 mg of Calcium each day and [phone] IU of Vitamin D daily.  Your body can only absorb 500 mg of Calcium at a time so Calcium must be taken in 2 or 3 divided doses throughout the day.  Multivitamin with folic acid- Once daily if it is possible for you to become pregnant.  Get these Immunizations  Gardasil-Series of three doses; prevents HPV related illness such as genital warts and cervical cancer.  Menactra-Single dose; prevents meningitis.  Tetanus shot- Every 10 years.  Flu shot-Every year.  Take these steps 1. Do not smoke-Your healthcare provider can help you quit.  For tips on how to quit go to www.smokefree.gov or call 1-800 QUITNOW. 2. Be physically active- Exercise 5 days a week for at least 30 minutes.  If you are not already physically active, start slow and gradually work up to 30 minutes of moderate physical activity.  Examples of moderate activity include walking briskly, dancing, swimming, bicycling, etc. 3. Breast Cancer- A self breast exam every month is important for early detection of breast cancer.  For more information and instruction on self breast exams, ask your healthcare provider or [employer].es. 4. Eat a healthy diet- Eat a variety of healthy foods such as fruits, vegetables, whole grains, low fat milk, low fat cheeses, yogurt, lean meats, poultry and fish, beans, nuts, tofu, etc.  For more information go to www. Thenutritionsource.org 5. Drink alcohol in moderation- Limit alcohol intake to one drink or less per day. Never drink and drive. 6. Depression- Your emotional health is as important as your physical health.  If you're feeling down or losing interest in things you normally enjoy please talk to your healthcare provider about being screened for depression. 7. Dental visit- Brush and floss your teeth twice daily; visit your dentist  twice a year. 8. Eye doctor- Get an eye exam at least every 2 years. 9. Helmet use- Always wear a helmet when riding a bicycle, motorcycle, rollerblading or skateboarding. 10. Safe sex- If you may be exposed to sexually transmitted infections, use a condom. 11. Seat belts- Seat belts can save your live; always wear one. 12. Smoke/Carbon Monoxide detectors- These detectors need to be installed on the appropriate level of your home. Replace batteries at least once a year. 13. Skin cancer- When out in the sun please cover up and use sunscreen 15 SPF or higher. 14. Violence- If anyone is threatening or hurting you, please tell your healthcare provider.

## 2015-09-21 NOTE — Patient Instructions (Addendum)
IF you received an x-ray today, you will receive an invoice from [facility]. Please contact [facility] at [phone] with questions or concerns regarding your invoice.   IF you received labwork today, you will receive an invoice from [employer]. Please contact [facility] at [phone] with questions or concerns regarding your invoice.   Our billing staff will not be able to assist you with questions regarding bills from these companies.  You will be contacted with the lab results as soon as they are available. The fastest way to get your results is to activate your My Chart account. Instructions are located on the last page of this paperwork. If you have not heard from [place] regarding the results in 2 weeks, please contact this office.   For allergies - continue allegra or zyrtec, add flonase if needed.  I will increase dose of one of your blood pressure meds, but return for recehck in next 6 weeks. Also see dash diet below to help manage blood pressure.   DASH Eating Plan DASH stands for "Dietary Approaches to Stop Hypertension." The DASH eating plan is a healthy eating plan that has been shown to reduce high blood pressure (hypertension). Additional health benefits may include reducing the risk of type 2 diabetes mellitus, heart disease, and stroke. The DASH eating plan may also help with weight loss. WHAT DO I NEED TO KNOW ABOUT THE DASH EATING PLAN? For the DASH eating plan, you will follow these general guidelines:  Choose foods with a percent daily value for sodium of less than 5% (as listed on the food label).  Use salt-free seasonings or herbs instead of table salt or sea salt.  Check with your health care provider or pharmacist before using salt substitutes.  Eat lower-sodium products, often labeled as "lower sodium" or "no salt added."  Eat fresh foods.  Eat more vegetables, fruits, and low-fat dairy products.  Choose whole  grains. Look for the word "whole" as the first word in the ingredient list.  Choose fish and skinless chicken or [place] more often than red meat. Limit fish, poultry, and meat to 6 oz (170 g) each day.  Limit sweets, desserts, sugars, and sugary drinks.  Choose heart-healthy fats.  Limit cheese to 1 oz (28 g) per day.  Eat more home-cooked food and less restaurant, buffet, and fast food.  Limit fried foods.  Cook foods using methods other than frying.  Limit canned vegetables. If you do use them, rinse them well to decrease the sodium.  When eating at a restaurant, ask that your food be prepared with less salt, or no salt if possible. WHAT FOODS CAN I EAT? Seek help from a dietitian for individual calorie needs. Grains Whole grain or whole wheat bread. Brown rice. Whole grain or whole wheat pasta. Quinoa, bulgur, and whole grain cereals. Low-sodium cereals. Corn or whole wheat flour tortillas. Whole grain cornbread. Whole grain crackers. Low-sodium crackers. Vegetables Fresh or frozen vegetables (raw, steamed, roasted, or grilled). Low-sodium or reduced-sodium tomato and vegetable juices. Low-sodium or reduced-sodium tomato sauce and paste. Low-sodium or reduced-sodium canned vegetables.  Fruits All fresh, canned (in natural juice), or frozen fruits. Meat and Other Protein Products Ground beef (85% or leaner), grass-fed beef, or beef trimmed of fat. Skinless chicken or [place]. Ground chicken or [place]. Pork trimmed of fat. All fish and seafood. Eggs. Dried beans, peas, or lentils. Unsalted nuts and seeds. Unsalted canned beans. Dairy Low-fat dairy products, such as skim or  1% milk, 2% or reduced-fat cheeses, low-fat ricotta or cottage cheese, or plain low-fat yogurt. Low-sodium or reduced-sodium cheeses. Fats and Oils Tub margarines without trans fats. Light or reduced-fat mayonnaise and salad dressings (reduced sodium). Avocado. Safflower, olive, or canola oils. Natural peanut or  almond butter. Other Unsalted popcorn and pretzels. The items listed above may not be a complete list of recommended foods or beverages. Contact your dietitian for more options. WHAT FOODS ARE NOT RECOMMENDED? Grains White bread. White pasta. White rice. Refined cornbread. Bagels and croissants. Crackers that contain trans fat. Vegetables Creamed or fried vegetables. Vegetables in a cheese sauce. Regular canned vegetables. Regular canned tomato sauce and paste. Regular tomato and vegetable juices. Fruits Dried fruits. Canned fruit in light or heavy syrup. Fruit juice. Meat and Other Protein Products Fatty cuts of meat. Ribs, chicken wings, bacon, sausage, bologna, salami, chitterlings, fatback, hot dogs, bratwurst, and packaged luncheon meats. Salted nuts and seeds. Canned beans with salt. Dairy Whole or 2% milk, cream, half-and-half, and cream cheese. Whole-fat or sweetened yogurt. Full-fat cheeses or blue cheese. Nondairy creamers and whipped toppings. Processed cheese, cheese spreads, or cheese curds. Condiments Onion and garlic salt, seasoned salt, table salt, and sea salt. Canned and packaged gravies. Worcestershire sauce. Tartar sauce. Barbecue sauce. Teriyaki sauce. Soy sauce, including reduced sodium. Steak sauce. Fish sauce. Oyster sauce. Cocktail sauce. Horseradish. Ketchup and mustard. Meat flavorings and tenderizers. Bouillon cubes. Hot sauce. Tabasco sauce. Marinades. Taco seasonings. Relishes. Fats and Oils Butter, stick margarine, lard, shortening, ghee, and bacon fat. Coconut, palm kernel, or palm oils. Regular salad dressings. Other Pickles and olives. Salted popcorn and pretzels. The items listed above may not be a complete list of foods and beverages to avoid. Contact your dietitian for more information. WHERE CAN I FIND MORE INFORMATION? National Heart, Lung, and Blood Institute: CablePromo.it   This information is not intended to  replace advice given to you by your health care provider. Make sure you discuss any questions you have with your health care provider.   Document Released: 05/03/2011 Document Revised: 06/04/2014 Document Reviewed: 03/18/2013 Elsevier Interactive Patient Education 2016 [employer].   Allergic Rhinitis Allergic rhinitis is when the mucous membranes in the nose respond to allergens. Allergens are particles in the air that cause your body to have an allergic reaction. This causes you to release allergic antibodies. Through a chain of events, these eventually cause you to release histamine into the blood stream. Although meant to protect the body, it is this release of histamine that causes your discomfort, such as frequent sneezing, congestion, and an itchy, runny nose.  CAUSES Seasonal allergic rhinitis (hay fever) is caused by pollen allergens that may come from grasses, trees, and weeds. Year-round allergic rhinitis (perennial allergic rhinitis) is caused by allergens such as house dust mites, pet dander, and mold spores. SYMPTOMS  Nasal stuffiness (congestion).  Itchy, runny nose with sneezing and tearing of the eyes. DIAGNOSIS Your health care provider can help you determine the allergen or allergens that trigger your symptoms. If you and your health care provider are unable to determine the allergen, skin or blood testing may be used. Your health care provider will diagnose your condition after taking your health history and performing a physical exam. Your health care provider may assess you for other related conditions, such as asthma, pink eye, or an ear infection. TREATMENT Allergic rhinitis does not have a cure, but it can be controlled by:  Medicines that block allergy symptoms. These may include  allergy shots, nasal sprays, and oral antihistamines.  Avoiding the allergen. Hay fever may often be treated with antihistamines in pill or nasal spray forms. Antihistamines block the  effects of histamine. There are over-the-counter medicines that may help with nasal congestion and swelling around the eyes. Check with your health care provider before taking or giving this medicine. If avoiding the allergen or the medicine prescribed do not work, there are many new medicines your health care provider can prescribe. Stronger medicine may be used if initial measures are ineffective. Desensitizing injections can be used if medicine and avoidance does not work. Desensitization is when a patient is given ongoing shots until the body becomes less sensitive to the allergen. Make sure you follow up with your health care provider if problems continue. HOME CARE INSTRUCTIONS It is not possible to completely avoid allergens, but you can reduce your symptoms by taking steps to limit your exposure to them. It helps to know exactly what you are allergic to so that you can avoid your specific triggers. SEEK MEDICAL CARE IF:  You have a fever.  You develop a cough that does not stop easily (persistent).  You have shortness of breath.  You start wheezing.  Symptoms interfere with normal daily activities.   This information is not intended to replace advice given to you by your health care provider. Make sure you discuss any questions you have with your health care provider.   Document Released: 02/06/2001 Document Revised: 06/04/2014 Document Reviewed: 01/19/2013 Elsevier Interactive Patient Education 2016 [employer].     Keeping You Healthy  Get These Tests  Blood Pressure- Have your blood pressure checked once a year by your health care provider.  Normal blood pressure is 120/80.  Weight- Have your body mass index (BMI) calculated to screen for obesity.  BMI is measure of body fat based on height and weight.  You can also calculate your own BMI at https://www.west-esparza.com/.  Cholesterol- Have your cholesterol checked every 5 years starting at age 33 then yearly starting at age  3.  Chlamydia, HIV, and other sexually transmitted diseases- Get screened every year until age 78, then within three months of each new sexual provider.  Pap Test - Every 1-5 years; discuss with your health care provider.  Mammogram- Every 1-2 years starting at age 75--50  Take these medicines  Calcium with Vitamin D-Your body needs 1200 mg of Calcium each day and [phone] IU of Vitamin D daily.  Your body can only absorb 500 mg of Calcium at a time so Calcium must be taken in 2 or 3 divided doses throughout the day.  Multivitamin with folic acid- Once daily if it is possible for you to become pregnant.  Get these Immunizations  Gardasil-Series of three doses; prevents HPV related illness such as genital warts and cervical cancer.  Menactra-Single dose; prevents meningitis.  Tetanus shot- Every 10 years.  Flu shot-Every year.  Take these steps 1. Do not smoke-Your healthcare provider can help you quit.  For tips on how to quit go to www.smokefree.gov or call 1-800 QUITNOW. 2. Be physically active- Exercise 5 days a week for at least 30 minutes.  If you are not already physically active, start slow and gradually work up to 30 minutes of moderate physical activity.  Examples of moderate activity include walking briskly, dancing, swimming, bicycling, etc. 3. Breast Cancer- A self breast exam every month is important for early detection of breast cancer.  For more information and instruction on self  breast exams, ask your healthcare provider or [place].eswww.womenshealth.gov/faq/breast-self-exam.cfm. 4. Eat a healthy diet- Eat a variety of healthy foods such as fruits, vegetables, whole grains, low fat milk, low fat cheeses, yogurt, lean meats, poultry and fish, beans, nuts, tofu, etc.  For more information go to www. Thenutritionsource.org 5. Drink alcohol in moderation- Limit alcohol intake to one drink or less per day. Never drink and drive. 6. Depression- Your emotional health is as important as  your physical health.  If you're feeling down or losing interest in things you normally enjoy please talk to your healthcare provider about being screened for depression. 7. Dental visit- Brush and floss your teeth twice daily; visit your dentist twice a year. 8. Eye doctor- Get an eye exam at least every 2 years. 9. Helmet use- Always wear a helmet when riding a bicycle, motorcycle, rollerblading or skateboarding. 10. Safe sex- If you may be exposed to sexually transmitted infections, use a condom. 11. Seat belts- Seat belts can save your live; always wear one. 12. Smoke/Carbon Monoxide detectors- These detectors need to be installed on the appropriate level of your home. Replace batteries at least once a year. 13. Skin cancer- When out in the sun please cover up and use sunscreen 15 SPF or higher. 14. Violence- If anyone is threatening or hurting you, please tell your healthcare provider.

## 2015-09-21 NOTE — Progress Notes (Signed)
   Subjective:    Patient ID: Holly Hays, female    DOB: Mar 17, 1966, 50 y.o.   MRN: [id number]  HPI    Review of Systems  Constitutional: Negative.   HENT: Positive for ear pain, rhinorrhea and sore throat.   Eyes: Positive for itching.  Respiratory: Negative.   Cardiovascular: Negative.   Gastrointestinal: Negative.   Endocrine: Negative.   Genitourinary: Negative.   Musculoskeletal: Negative.   Skin: Negative.   Allergic/Immunologic: Negative.   Neurological: Negative.   Hematological: Negative.   Psychiatric/Behavioral: Negative.        Objective:   Physical Exam        Assessment & Plan:

## 2015-09-22 ENCOUNTER — Encounter: Payer: Self-pay | Admitting: Family Medicine

## 2015-09-26 ENCOUNTER — Ambulatory Visit (INDEPENDENT_AMBULATORY_CARE_PROVIDER_SITE_OTHER): Payer: Federal, State, Local not specified - PPO | Admitting: Family Medicine

## 2015-09-26 VITALS — BP 150/86 | HR 77 | Temp 98.1°F | Resp 17 | Ht 68.0 in | Wt 222.2 lb

## 2015-09-26 DIAGNOSIS — R350 Frequency of micturition: Secondary | ICD-10-CM

## 2015-09-26 DIAGNOSIS — T50905A Adverse effect of unspecified drugs, medicaments and biological substances, initial encounter: Secondary | ICD-10-CM

## 2015-09-26 LAB — POCT URINALYSIS DIP (MANUAL ENTRY)
BILIRUBIN UA: NEGATIVE
Glucose, UA: NEGATIVE
NITRITE UA: NEGATIVE
Protein Ur, POC: 30 — AB
Spec Grav, UA: >=1.03
Urobilinogen, UA: 0.2
pH, UA: 5.5

## 2015-09-26 LAB — POC MICROSCOPIC URINALYSIS (UMFC): MUCUS RE: ABSENT

## 2015-09-26 LAB — GLUCOSE, POCT (MANUAL RESULT ENTRY): POC GLUCOSE: 109 mg/dL — AB (ref 70–99)

## 2015-09-26 NOTE — Patient Instructions (Signed)
I believe your symptoms are being caused by the increased diuretic in your blood pressure pill (hydrochlorothiazide 25 mg)  Try to not drink excessive caffeine which is going to exacerbate your symptoms  Return if problems.

## 2015-09-26 NOTE — Progress Notes (Signed)
Patient ID: Holly Hays, female    DOB: Jan 05, 1966  Age: 50 y.o. MRN: [id number]  Chief Complaint  Patient presents with  . Urinary Frequency    x 3 days    Subjective:   Has been having urinary frequency the last few days. No real dysuria. It turns out her blood pressure medicines were changed little bit last week. I had to find that out later in the visit.  Current allergies, medications, problem list, past/family and social histories reviewed.  Objective:  BP 150/86 mmHg  Pulse 77  Temp(Src) 98.1 F (36.7 C) (Oral)  Resp 17  Ht 5\' 8"  (1.727 m)  Wt 222 lb 3.2 oz (100.789 kg)  BMI 33.79 kg/m2  SpO2 97%  LMP 09/15/2015  No CVA tenderness. Abdomen soft without mass or tenderness.  Assessment & Plan:   Assessment: 1. Urinary frequency       Plan: Check your blood sugar which was normal as well as urinalysis. With the trace blood cells and trace leukocytes and did go ahead and cultured her. However I explained to her that I feel confident that it is the increased dose of diuretic that is causing her problem. I hope she adapts to it. If she does not she is to contact Dr. Neva Seat. Results for orders placed or performed in visit on 09/26/15  POCT urinalysis dipstick  Result Value Ref Range   Color, UA yellow yellow   Clarity, UA cloudy (A) clear   Glucose, UA negative negative   Bilirubin, UA small (A) negative   Ketones, POC UA negative negative   Spec Grav, UA >=1.030    Blood, UA trace-lysed (A) negative   pH, UA 5.5    Protein Ur, POC =30 (A) negative   Urobilinogen, UA 0.2    Nitrite, UA Negative Negative   Leukocytes, UA Trace (A) Negative  POCT Microscopic Urinalysis ([facility])  Result Value Ref Range   WBC,UR,HPF,POC Few (A) None WBC/hpf   RBC,UR,HPF,POC Few (A) None RBC/hpf   Bacteria Few (A) None, Too numerous to count   Mucus Absent Absent   Epithelial Cells, UR Per Microscopy Few (A) None, Too numerous to count cells/hpf  POCT glucose (manual entry)   Result Value Ref Range   POC Glucose 109 (A) 70 - 99 mg/dl    Orders Placed This Encounter  Procedures  . Urine culture  . POCT urinalysis dipstick  . POCT Microscopic Urinalysis ([facility])  . POCT glucose (manual entry)    No orders of the defined types were placed in this encounter.    Results for orders placed or performed in visit on 09/26/15  POCT urinalysis dipstick  Result Value Ref Range   Color, UA yellow yellow   Clarity, UA cloudy (A) clear   Glucose, UA negative negative   Bilirubin, UA small (A) negative   Ketones, POC UA negative negative   Spec Grav, UA >=1.030    Blood, UA trace-lysed (A) negative   pH, UA 5.5    Protein Ur, POC =30 (A) negative   Urobilinogen, UA 0.2    Nitrite, UA Negative Negative   Leukocytes, UA Trace (A) Negative  POCT Microscopic Urinalysis ([facility])  Result Value Ref Range   WBC,UR,HPF,POC Few (A) None WBC/hpf   RBC,UR,HPF,POC Few (A) None RBC/hpf   Bacteria Few (A) None, Too numerous to count   Mucus Absent Absent   Epithelial Cells, UR Per Microscopy Few (A) None, Too numerous to count cells/hpf  POCT glucose (manual entry)  Result Value Ref Range   POC Glucose 109 (A) 70 - 99 mg/dl        Patient Instructions  I believe your symptoms are being caused by the increased diuretic in your blood pressure pill (hydrochlorothiazide 25 mg)  Try to not drink excessive caffeine which is going to exacerbate your symptoms  Return if problems.     No Follow-up on file.   Tayvin Preslar, MD 09/26/2015

## 2015-09-27 LAB — URINE CULTURE

## 2015-11-01 ENCOUNTER — Other Ambulatory Visit: Payer: Self-pay

## 2015-11-01 DIAGNOSIS — R220 Localized swelling, mass and lump, head: Secondary | ICD-10-CM

## 2015-11-01 DIAGNOSIS — T7840XA Allergy, unspecified, initial encounter: Secondary | ICD-10-CM

## 2015-11-01 MED ORDER — TRIAMCINOLONE ACETONIDE 0.1 % EX CREA: 1.0000 "application " | TOPICAL_CREAM | Freq: Two times a day (BID) | CUTANEOUS | Status: DC

## 2015-11-01 NOTE — Telephone Encounter (Signed)
Refilled for eczema. If any new rash, return for recheck.

## 2015-11-01 NOTE — Telephone Encounter (Signed)
Dr Neva SeatGreene, we have Rxd for pt in the past for her eczema. She had annual w/you in April, but don't see eczema addressed. Do you want to RF or have her come back for eval?

## 2015-11-02 ENCOUNTER — Ambulatory Visit (INDEPENDENT_AMBULATORY_CARE_PROVIDER_SITE_OTHER): Payer: Federal, State, Local not specified - PPO | Admitting: Physician Assistant

## 2015-11-02 VITALS — BP 134/86 | HR 60 | Temp 98.1°F | Resp 16 | Ht 69.0 in | Wt 219.0 lb

## 2015-11-02 DIAGNOSIS — L568 Other specified acute skin changes due to ultraviolet radiation: Secondary | ICD-10-CM

## 2015-11-02 DIAGNOSIS — L309 Dermatitis, unspecified: Secondary | ICD-10-CM

## 2015-11-02 MED ORDER — TRIAMCINOLONE ACETONIDE 0.1 % EX CREA: 1.0000 "application " | TOPICAL_CREAM | Freq: Two times a day (BID) | CUTANEOUS | Status: DC

## 2015-11-02 MED ORDER — HYDROXYZINE HCL 25 MG PO TABS: 12.5000 mg | ORAL_TABLET | Freq: Three times a day (TID) | ORAL | Status: DC | PRN

## 2015-11-02 NOTE — Progress Notes (Signed)
[facility] [address] [place] [address] [phone]  Date:  11/02/2015   Name:  Holly Hays   DOB:  28-Feb-1966   MRN:  [id number]  PCP:  Tally Due, MD   History of Present Illness:  Holly Hays is a 50 y.o. female patient who presents to [facility] for cc of rash. Started 4 days ago along the arms and chest.  She states it is pruritic. No fever, ur symptoms.  No sob or dyspnea.  No change in hygeine products.  She was outdoors with grandchild.  She did not use any sunblock at the time.     Patient Active Problem List   Diagnosis Date Noted  . Hypertension 08/06/2012  . Hypothyroidism 08/06/2012  . Thyroid goiter 08/06/2012    Past Medical History  Diagnosis Date  . Hypertension   . Thyroid disease   . Allergy     Past Surgical History  Procedure Laterality Date  . Breast reduction      Social History  Substance Use Topics  . Smoking status: Never Smoker   . Smokeless tobacco: None  . Alcohol Use: No    Family History  Problem Relation Age of Onset  . Diabetes Mother   . Stroke Father   . Diabetes Sister     Allergies  Allergen Reactions  . Latex Itching and Rash    Medication list has been reviewed and updated.  Current Outpatient Prescriptions on File Prior to Visit  Medication Sig Dispense Refill  . famotidine (PEPCID) 20 MG tablet TAKE ONE TABLET BY MOUTH TWICE DAILY 180 tablet 1  . levothyroxine (SYNTHROID, LEVOTHROID) 100 MCG tablet Take 1 tablet (100 mcg total) by mouth daily. 90 tablet 1  . lisinopril-hydrochlorothiazide (PRINZIDE,ZESTORETIC) 20-25 MG tablet Take 1 tablet by mouth daily. 90 tablet 1  . metoprolol (TOPROL XL) 200 MG 24 hr tablet Take 1 tablet (200 mg total) by mouth daily. 90 tablet 1  . triamcinolone cream (KENALOG) 0.1 % Apply 1 application topically 2 (two) times daily. 80 g 1  . valACYclovir (VALTREX) 500 MG tablet TAKE ONE CAPLET BY MOUTH ONCE DAILY 90 tablet 3   No current  facility-administered medications on file prior to visit.    ROS ROS otherwise unremarkable unless listed above.   Physical Examination: BP 134/86 mmHg  Pulse 60  Temp(Src) 98.1 F (36.7 C)  Resp 16  Ht 5\' 9"  (1.753 m)  Wt 219 lb (99.338 kg)  BMI 32.33 kg/m2  SpO2 97%  LMP 10/27/2015 (Approximate) Ideal Body Weight: Weight in (lb) to have BMI = 25: 168.9  Physical Exam  Constitutional: She is oriented to person, place, and time. She appears well-developed and well-nourished. No distress.  HENT:  Head: Normocephalic and atraumatic.  Right Ear: External ear normal.  Left Ear: External ear normal.  Eyes: Conjunctivae and EOM are normal. Pupils are equal, round, and reactive to light.  Cardiovascular: Normal rate.   Pulmonary/Chest: Effort normal. No respiratory distress.  Neurological: She is alert and oriented to person, place, and time.  Skin: Skin is warm and dry. She is not diaphoretic.  Nonerythematous or discolored papules along the anterior dorsal forearms and arms.  Present along upper chest/clavicular area.  No pupules or flaking at this time.    Psychiatric: She has a normal mood and affect. Her behavior is normal.     Assessment and Plan: Holly Hays is a 50 y.o. female who is here today  for cc of rash. Diff dx: allergic dermatitis, photosensitivity rash, eczema Appears more secondary to sun-exposure. Advised triamcinolone cream at this time, and vistaril to soothe itching.  Will rtc in 7 days if no improvement.  Eczema - Plan: hydrOXYzine (ATARAX/VISTARIL) 25 MG tablet, triamcinolone cream (KENALOG) 0.1 %, Care order/instruction  Photosensitive contact dermatitis - Plan: hydrOXYzine (ATARAX/VISTARIL) 25 MG tablet, triamcinolone cream (KENALOG) 0.1 %, Care order/instruction   Trena Platt, PA-C [facility] 11/02/2015 9:04 AM

## 2015-11-02 NOTE — Telephone Encounter (Signed)
LMOM for pt that RFs were sent. If this is for a new rash, and not her eczema, advised her to come in for eval to make sure she is getting the correct medication.

## 2015-11-02 NOTE — Patient Instructions (Addendum)
     IF you received an x-ray today, you will receive an invoice from [facility]. Please contact [facility] at [phone] with questions or concerns regarding your invoice.   IF you received labwork today, you will receive an invoice from [facility]. Please contact [facility] at [phone] with questions or concerns regarding your invoice.   Our billing staff will not be able to assist you with questions regarding bills from these companies.  You will be contacted with the lab results as soon as they are available. The fastest way to get your results is to activate your My Chart account. Instructions are located on the last page of this paperwork. If you have not heard from us regarding the results in 2 weeks, please contact this office.    Make sure that you are wearing sunscreen or sunblock when you are outdoors in the sun. Please use the steroid cream no longer than 2 weeks. Vistaril is sedating, so be careful.  It is good for the itch.  You can take it every three hours, but with sleep, may be best.

## 2015-11-17 ENCOUNTER — Ambulatory Visit: Payer: Federal, State, Local not specified - PPO | Admitting: Family Medicine

## 2015-11-24 ENCOUNTER — Encounter: Payer: Federal, State, Local not specified - PPO | Admitting: Family Medicine

## 2015-11-29 ENCOUNTER — Other Ambulatory Visit: Payer: Self-pay | Admitting: Physician Assistant

## 2016-03-06 DIAGNOSIS — R300 Dysuria: Secondary | ICD-10-CM | POA: Diagnosis not present

## 2016-03-08 ENCOUNTER — Ambulatory Visit: Payer: Federal, State, Local not specified - PPO | Admitting: Family Medicine

## 2016-03-12 DIAGNOSIS — N852 Hypertrophy of uterus: Secondary | ICD-10-CM | POA: Diagnosis not present

## 2016-03-12 DIAGNOSIS — N920 Excessive and frequent menstruation with regular cycle: Secondary | ICD-10-CM | POA: Diagnosis not present

## 2016-03-12 DIAGNOSIS — N921 Excessive and frequent menstruation with irregular cycle: Secondary | ICD-10-CM | POA: Diagnosis not present

## 2016-03-22 ENCOUNTER — Ambulatory Visit: Payer: Federal, State, Local not specified - PPO | Admitting: Family Medicine

## 2016-04-30 ENCOUNTER — Other Ambulatory Visit: Payer: Self-pay | Admitting: Family Medicine

## 2016-04-30 DIAGNOSIS — B029 Zoster without complications: Secondary | ICD-10-CM

## 2016-04-30 DIAGNOSIS — A6000 Herpesviral infection of urogenital system, unspecified: Secondary | ICD-10-CM

## 2016-05-04 ENCOUNTER — Ambulatory Visit (INDEPENDENT_AMBULATORY_CARE_PROVIDER_SITE_OTHER): Payer: Federal, State, Local not specified - PPO | Admitting: Family Medicine

## 2016-05-04 VITALS — BP 161/94 | HR 62 | Temp 97.5°F | Ht 69.0 in | Wt 222.2 lb

## 2016-05-04 DIAGNOSIS — B009 Herpesviral infection, unspecified: Secondary | ICD-10-CM

## 2016-05-04 DIAGNOSIS — I10 Essential (primary) hypertension: Secondary | ICD-10-CM

## 2016-05-04 DIAGNOSIS — K219 Gastro-esophageal reflux disease without esophagitis: Secondary | ICD-10-CM

## 2016-05-04 DIAGNOSIS — E039 Hypothyroidism, unspecified: Secondary | ICD-10-CM | POA: Diagnosis not present

## 2016-05-04 MED ORDER — FAMOTIDINE 20 MG PO TABS: 20.0000 mg | ORAL_TABLET | Freq: Two times a day (BID) | ORAL | 3 refills | Status: DC

## 2016-05-04 MED ORDER — LEVOTHYROXINE SODIUM 100 MCG PO TABS: 100.0000 ug | ORAL_TABLET | Freq: Every day | ORAL | 3 refills | Status: DC

## 2016-05-04 MED ORDER — METOPROLOL SUCCINATE ER 200 MG PO TB24: 200.0000 mg | ORAL_TABLET | Freq: Every day | ORAL | 1 refills | Status: DC

## 2016-05-04 MED ORDER — VALACYCLOVIR HCL 500 MG PO TABS: ORAL_TABLET | ORAL | 3 refills | Status: DC

## 2016-05-04 MED ORDER — LISINOPRIL-HYDROCHLOROTHIAZIDE 20-25 MG PO TABS: 2.0000 | ORAL_TABLET | Freq: Every day | ORAL | 1 refills | Status: DC

## 2016-05-04 NOTE — Progress Notes (Addendum)
Patient ID: Holly Hays, female    DOB: [birth year], 50 y.o.   MRN: [id number]  PCP: Kennon Portela, MD  Chief Complaint  Patient presents with  . Medication Refill    Valacyclovir & Levothyoxine Sodium    Subjective:   HPI 50 year old female presents for evaluation of medication refill. Pt is established at [facility], although she presents newly to me.  GERD Famotidine, controls symptoms. Negative EGD 1-2 years ago  HTN Reports elevation in blood pressure readings are related to stress at work. Occasional monitors blood pressure and readings are  sometimes in 569'V systolic and greater than 90 diastolic. No sodium added to food.  Regularly works out and recently started attending a workout boot camp. She reports that she doesn't eat out and self prepares meals. Lost 20 lbs over the last few months. Doesn't understand the reason her blood pressure is non-controlled.  Herpes Virus  Would like a retest for HSV 1 and HSV 2t. She has been on suppression therapy for several years but feels that her initial positive results were inaccurate. She is aware that antiviral therapy is not curative only suppress flare-ups. Remains insistent on retesting.  Review of Systems See HPI    Patient Active Problem List   Diagnosis Date Noted  . Hypertension 08/06/2012  . Hypothyroidism 08/06/2012  . Thyroid goiter 08/06/2012     Prior to Admission medications   Medication Sig Start Date End Date Taking? Authorizing Provider  famotidine (PEPCID) 20 MG tablet TAKE ONE TABLET BY MOUTH TWICE DAILY 08/12/15  Yes Wendie Agreste, MD  hydrOXYzine (ATARAX/VISTARIL) 25 MG tablet Take 0.5-1 tablets (12.5-25 mg total) by mouth every 8 (eight) hours as needed for itching. 11/02/15  Yes Dorian Heckle English, PA  levothyroxine (SYNTHROID, LEVOTHROID) 100 MCG tablet Take 1 tablet (100 mcg total) by mouth daily. 09/21/15  Yes Wendie Agreste, MD  lisinopril-hydrochlorothiazide (PRINZIDE,ZESTORETIC) 20-25  MG tablet Take 1 tablet by mouth daily. 09/21/15  Yes Wendie Agreste, MD  metoprolol (TOPROL XL) 200 MG 24 hr tablet Take 1 tablet (200 mg total) by mouth daily. 09/21/15  Yes Wendie Agreste, MD  triamcinolone cream (KENALOG) 0.1 % Apply 1 application topically 2 (two) times daily. 11/02/15  Yes Dorian Heckle English, PA  valACYclovir (VALTREX) 500 MG tablet TAKE ONE CAPLET BY MOUTH ONCE DAILY 06/16/15  Yes Wendie Agreste, MD   Allergies  Allergen Reactions  . Latex Itching and Rash     Objective:  Physical Exam  Constitutional: She is oriented to person, place, and time. She appears well-developed and well-nourished.  HENT:  Head: Normocephalic and atraumatic.  Eyes: Conjunctivae are normal. Pupils are equal, round, and reactive to light.  Neck: Normal range of motion. No JVD present.  Cardiovascular: Normal rate, regular rhythm, normal heart sounds and intact distal pulses.   No murmur heard. Pulmonary/Chest: Effort normal and breath sounds normal.  Neurological: She is alert and oriented to person, place, and time.  Skin: Skin is warm and dry.  Psychiatric: She has a normal mood and affect. Her behavior is normal. Judgment and thought content normal.     Today's Vitals   05/04/16 1033  BP: (!) 161/94  Pulse: 62  Temp: 97.5 F (36.4 C)  TempSrc: Oral  SpO2: 99%  Weight: 222 lb 3.2 oz (100.8 kg)  Height: 5' 9"  (1.753 m)   Assessment & Plan:  1. Gastroesophageal reflux disease without esophagitis - CBC with Differential/Platelet Plan: -Famotidine (Pepcid)  20 mg 2 times daily   2. Hypertension, unspecified type, Controlled - CBC with Differential/Platelet - Lipid panel - CMP14+EGFR Plan: Lisinopril-HCTZ 20-25 mg two tablets daily Metoprolol 200 mg daily   3. Hypothyroidism, unspecified type - Thyroid Panel With TSH-see results Plan: Levothyroxine (Synthroid) 50 mcg daily reduced from 100 mcg.  4. Herpes - HSV(herpes simplex vrs) 1+2 ab-IgG - Valacyclovir   (VALTREX) 500 MG tablet; TAKE ONE CAPLET BY MOUTH ONCE DAILY    Return for follow-up in 6 months.  Continue to monitor blood pressure. If consistently remains elevated greater than 150/90, return for care sooner.  Carroll Sage. Kenton Kingfisher, MSN, FNP-C [facility]

## 2016-05-04 NOTE — Patient Instructions (Addendum)
Return for follow-up in 6 months for hypertension follow-up.  I have refilled your medication as requested.  IF you received an x-ray today, you will receive an invoice from [facility]. Please contact [facility] at [phone] with questions or concerns regarding your invoice.   IF you received labwork today, you will receive an invoice from [facility]. Please contact [facility] at [phone] with questions or concerns regarding your invoice.   Our billing staff will not be able to assist you with questions regarding bills from these companies.  You will be contacted with the lab results as soon as they are available. The fastest way to get your results is to activate your My Chart account. Instructions are located on the last page of this paperwork. If you have not heard from us regarding the results in 2 weeks, please contact this office.     Hypertension Hypertension, commonly called high blood pressure, is when the force of blood pumping through your arteries is too strong. Your arteries are the blood vessels that carry blood from your heart throughout your body. A blood pressure reading consists of a higher number over a lower number, such as 110/72. The higher number (systolic) is the pressure inside your arteries when your heart pumps. The lower number (diastolic) is the pressure inside your arteries when your heart relaxes. Ideally you want your blood pressure below 120/80. Hypertension forces your heart to work harder to pump blood. Your arteries may become narrow or stiff. Having untreated or uncontrolled hypertension can cause heart attack, stroke, kidney disease, and other problems. What increases the risk? Some risk factors for high blood pressure are controllable. Others are not. Risk factors you cannot control include:  Race. You may be at higher risk if you are African American.  Age. Risk increases with age.  Gender. Men are at  higher risk than women before age 245 years. After age 48, women are at higher risk than men. Risk factors you can control include:  Not getting enough exercise or physical activity.  Being overweight.  Getting too much fat, sugar, calories, or salt in your diet.  Drinking too much alcohol. What are the signs or symptoms? Hypertension does not usually cause signs or symptoms. Extremely high blood pressure (hypertensive crisis) may cause headache, anxiety, shortness of breath, and nosebleed. How is this diagnosed? To check if you have hypertension, your health care provider will measure your blood pressure while you are seated, with your arm held at the level of your heart. It should be measured at least twice using the same arm. Certain conditions can cause a difference in blood pressure between your right and left arms. A blood pressure reading that is higher than normal on one occasion does not mean that you need treatment. If it is not clear whether you have high blood pressure, you may be asked to return on a different day to have your blood pressure checked again. Or, you may be asked to monitor your blood pressure at home for 1 or more weeks. How is this treated? Treating high blood pressure includes making lifestyle changes and possibly taking medicine. Living a healthy lifestyle can help lower high blood pressure. You may need to change some of your habits. Lifestyle changes may include:  Following the DASH diet. This diet is high in fruits, vegetables, and whole grains. It is low in salt, red meat, and added sugars.  Keep your sodium intake below 2,300 mg per day.  Getting at  least 30-45 minutes of aerobic exercise at least 4 times per week.  Losing weight if necessary.  Not smoking.  Limiting alcoholic beverages.  Learning ways to reduce stress. Your health care provider may prescribe medicine if lifestyle changes are not enough to get your blood pressure under control, and if  one of the following is true:  You are 6218-48 years of age and your systolic blood pressure is above 140.  You are 48 years of age or older, and your systolic blood pressure is above 150.  Your diastolic blood pressure is above 90.  You have diabetes, and your systolic blood pressure is over 140 or your diastolic blood pressure is over 90.  You have kidney disease and your blood pressure is above 140/90.  You have heart disease and your blood pressure is above 140/90. Your personal target blood pressure may vary depending on your medical conditions, your age, and other factors. Follow these instructions at home:  Have your blood pressure rechecked as directed by your health care provider.  Take medicines only as directed by your health care provider. Follow the directions carefully. Blood pressure medicines must be taken as prescribed. The medicine does not work as well when you skip doses. Skipping doses also puts you at risk for problems.  Do not smoke.  Monitor your blood pressure at home as directed by your health care provider. Contact a health care provider if:  You think you are having a reaction to medicines taken.  You have recurrent headaches or feel dizzy.  You have swelling in your ankles.  You have trouble with your vision. Get help right away if:  You develop a severe headache or confusion.  You have unusual weakness, numbness, or feel faint.  You have severe chest or abdominal pain.  You vomit repeatedly.  You have trouble breathing. This information is not intended to replace advice given to you by your health care provider. Make sure you discuss any questions you have with your health care provider. Document Released: 05/14/2005 Document Revised: 10/20/2015 Document Reviewed: 03/06/2013 Elsevier Interactive Patient Education  2017 [employer]

## 2016-05-05 LAB — CBC WITH DIFFERENTIAL/PLATELET
Basophils Absolute: 0 10*3/uL (ref 0.0–0.2)
Basos: 0 %
EOS (ABSOLUTE): 0.1 10*3/uL (ref 0.0–0.4)
EOS: 2 %
HEMATOCRIT: 37.7 % (ref 34.0–46.6)
Hemoglobin: 12.7 g/dL (ref 11.1–15.9)
Immature Grans (Abs): 0 10*3/uL (ref 0.0–0.1)
Immature Granulocytes: 0 %
LYMPHS ABS: 2 10*3/uL (ref 0.7–3.1)
Lymphs: 30 %
MCH: 32.4 pg (ref 26.6–33.0)
MCHC: 33.7 g/dL (ref 31.5–35.7)
MCV: 96 fL (ref 79–97)
MONOS ABS: 0.8 10*3/uL (ref 0.1–0.9)
Monocytes: 12 %
Neutrophils Absolute: 3.7 10*3/uL (ref 1.4–7.0)
Neutrophils: 56 %
PLATELETS: 251 10*3/uL (ref 150–379)
RBC: 3.92 x10E6/uL (ref 3.77–5.28)
RDW: 12.9 % (ref 12.3–15.4)
WBC: 6.8 10*3/uL (ref 3.4–10.8)

## 2016-05-05 LAB — CMP14+EGFR
ALK PHOS: 72 IU/L (ref 39–117)
ALT: 37 IU/L — AB (ref 0–32)
AST: 34 IU/L (ref 0–40)
Albumin/Globulin Ratio: 1.2 (ref 1.2–2.2)
Albumin: 4.3 g/dL (ref 3.5–5.5)
BILIRUBIN TOTAL: 0.5 mg/dL (ref 0.0–1.2)
BUN/Creatinine Ratio: 14 (ref 9–23)
BUN: 13 mg/dL (ref 6–24)
CO2: 24 mmol/L (ref 18–29)
Calcium: 9.8 mg/dL (ref 8.7–10.2)
Chloride: 100 mmol/L (ref 96–106)
Creatinine, Ser: 0.94 mg/dL (ref 0.57–1.00)
GFR, EST AFRICAN AMERICAN: 83 mL/min/{1.73_m2} (ref >59)
GFR, EST NON AFRICAN AMERICAN: 72 mL/min/{1.73_m2} (ref >59)
GLUCOSE: 106 mg/dL — AB (ref 65–99)
Globulin, Total: 3.5 g/dL (ref 1.5–4.5)
POTASSIUM: 4.2 mmol/L (ref 3.5–5.2)
SODIUM: 141 mmol/L (ref 134–144)
Total Protein: 7.8 g/dL (ref 6.0–8.5)

## 2016-05-05 LAB — LIPID PANEL
CHOL/HDL RATIO: 4.3 ratio (ref 0.0–4.4)
Cholesterol, Total: 183 mg/dL (ref 100–199)
HDL: 43 mg/dL (ref >39)
LDL Calculated: 118 mg/dL — ABNORMAL HIGH (ref 0–99)
TRIGLYCERIDES: 108 mg/dL (ref 0–149)
VLDL Cholesterol Cal: 22 mg/dL (ref 5–40)

## 2016-05-05 LAB — HSV(HERPES SIMPLEX VRS) I + II AB-IGG
HSV 1 GLYCOPROTEIN G AB, IGG: 5.17 {index} — AB (ref 0.00–0.90)
HSV 2 GLYCOPROTEIN G AB, IGG: 17 {index} — AB (ref 0.00–0.90)

## 2016-05-05 LAB — THYROID PANEL WITH TSH
FREE THYROXINE INDEX: 2.4 (ref 1.2–4.9)
T3 UPTAKE RATIO: 28 % (ref 24–39)
T4, Total: 8.6 ug/dL (ref 4.5–12.0)
TSH: 0.177 u[IU]/mL — ABNORMAL LOW (ref 0.450–4.500)

## 2016-05-12 MED ORDER — LEVOTHYROXINE SODIUM 50 MCG PO TABS: 100.0000 ug | ORAL_TABLET | Freq: Every day | ORAL | 1 refills | Status: DC

## 2016-07-05 DIAGNOSIS — Z124 Encounter for screening for malignant neoplasm of cervix: Secondary | ICD-10-CM | POA: Diagnosis not present

## 2017-01-21 DIAGNOSIS — Z1231 Encounter for screening mammogram for malignant neoplasm of breast: Secondary | ICD-10-CM | POA: Diagnosis not present

## 2017-05-07 ENCOUNTER — Other Ambulatory Visit: Payer: Self-pay

## 2017-05-07 ENCOUNTER — Ambulatory Visit: Payer: Federal, State, Local not specified - PPO | Admitting: Family Medicine

## 2017-05-07 ENCOUNTER — Encounter: Payer: Self-pay | Admitting: Family Medicine

## 2017-05-07 VITALS — BP 173/116 | HR 78 | Temp 98.3°F | Resp 17 | Ht 69.0 in | Wt 231.0 lb

## 2017-05-07 DIAGNOSIS — J069 Acute upper respiratory infection, unspecified: Secondary | ICD-10-CM

## 2017-05-07 DIAGNOSIS — K219 Gastro-esophageal reflux disease without esophagitis: Secondary | ICD-10-CM | POA: Diagnosis not present

## 2017-05-07 DIAGNOSIS — I10 Essential (primary) hypertension: Secondary | ICD-10-CM | POA: Diagnosis not present

## 2017-05-07 DIAGNOSIS — L309 Dermatitis, unspecified: Secondary | ICD-10-CM | POA: Diagnosis not present

## 2017-05-07 DIAGNOSIS — E039 Hypothyroidism, unspecified: Secondary | ICD-10-CM

## 2017-05-07 DIAGNOSIS — R739 Hyperglycemia, unspecified: Secondary | ICD-10-CM | POA: Diagnosis not present

## 2017-05-07 DIAGNOSIS — B009 Herpesviral infection, unspecified: Secondary | ICD-10-CM

## 2017-05-07 DIAGNOSIS — E785 Hyperlipidemia, unspecified: Secondary | ICD-10-CM | POA: Diagnosis not present

## 2017-05-07 DIAGNOSIS — G4700 Insomnia, unspecified: Secondary | ICD-10-CM | POA: Diagnosis not present

## 2017-05-07 MED ORDER — FAMOTIDINE 20 MG PO TABS: 20.0000 mg | ORAL_TABLET | Freq: Two times a day (BID) | ORAL | 3 refills | Status: DC

## 2017-05-07 MED ORDER — METOPROLOL SUCCINATE ER 200 MG PO TB24: 200.0000 mg | ORAL_TABLET | Freq: Every day | ORAL | 1 refills | Status: DC

## 2017-05-07 MED ORDER — TRIAMCINOLONE ACETONIDE 0.1 % EX CREA: 1.0000 "application " | TOPICAL_CREAM | Freq: Two times a day (BID) | CUTANEOUS | 3 refills | Status: DC

## 2017-05-07 MED ORDER — HYDROXYZINE HCL 25 MG PO TABS: 12.5000 mg | ORAL_TABLET | Freq: Every evening | ORAL | 0 refills | Status: DC | PRN

## 2017-05-07 MED ORDER — LEVOTHYROXINE SODIUM 50 MCG PO TABS: 50.0000 ug | ORAL_TABLET | Freq: Every day | ORAL | 1 refills | Status: DC

## 2017-05-07 MED ORDER — LISINOPRIL-HYDROCHLOROTHIAZIDE 20-12.5 MG PO TABS: 2.0000 | ORAL_TABLET | Freq: Every day | ORAL | 1 refills | Status: DC

## 2017-05-07 MED ORDER — VALACYCLOVIR HCL 500 MG PO TABS
ORAL_TABLET | ORAL | 3 refills | Status: DC
Start: 2017-05-07 — End: 2018-05-13

## 2017-05-07 NOTE — Patient Instructions (Addendum)
Blood pressure medication increased to 2 pills each day of the new lisinopril HCTZ dose. No other change in medications. Keep a record of your blood pressures outside of the visit and follow-up in 2 weeks.  No change in other medications for now. I will check some lab work today.  Coricidin HBP if needed for congestion or cough, saline nasal spray if needed for nasal congestion, see information on upper respiratory infections below. Return to the clinic or go to the nearest emergency room if any of your symptoms worsen or new symptoms occur.   Upper Respiratory Infection, Adult Most upper respiratory infections (URIs) are caused by a virus. A URI affects the nose, throat, and upper air passages. The most common type of URI is often called "the common cold." Follow these instructions at home:  Take medicines only as told by your doctor.  Gargle warm saltwater or take cough drops to comfort your throat as told by your doctor.  Use a warm mist humidifier or inhale steam from a shower to increase air moisture. This may make it easier to breathe.  Drink enough fluid to keep your pee (urine) clear or pale yellow.  Eat soups and other clear broths.  Have a healthy diet.  Rest as needed.  Go back to work when your fever is gone or your doctor says it is okay. ? You may need to stay home longer to avoid giving your URI to others. ? You can also wear a face mask and wash your hands often to prevent spread of the virus.  Use your inhaler more if you have asthma.  Do not use any tobacco products, including cigarettes, chewing tobacco, or electronic cigarettes. If you need help quitting, ask your doctor. Contact a doctor if:  You are getting worse, not better.  Your symptoms are not helped by medicine.  You have chills.  You are getting more short of breath.  You have brown or red mucus.  You have yellow or brown discharge from your nose.  You have pain in your face, especially when  you bend forward.  You have a fever.  You have puffy (swollen) neck glands.  You have pain while swallowing.  You have white areas in the back of your throat. Get help right away if:  You have very bad or constant: ? Headache. ? Ear pain. ? Pain in your forehead, behind your eyes, and over your cheekbones (sinus pain). ? Chest pain.  You have long-lasting (chronic) lung disease and any of the following: ? Wheezing. ? Long-lasting cough. ? Coughing up blood. ? A change in your usual mucus.  You have a stiff neck.  You have changes in your: ? Vision. ? Hearing. ? Thinking. ? Mood. This information is not intended to replace advice given to you by your health care provider. Make sure you discuss any questions you have with your health care provider. Document Released: 10/31/2007 Document Revised: 01/15/2016 Document Reviewed: 08/19/2013 Elsevier Interactive Patient Education  2018 [employer]   IF you received an x-ray today, you will receive an invoice from [facility]. Please contact [facility] at [phone] with questions or concerns regarding your invoice.   IF you received labwork today, you will receive an invoice from [facility]. Please contact [facility] at [phone] with questions or concerns regarding your invoice.   Our billing staff will not be able to assist you with questions regarding bills from these companies.  You will be contacted with the lab results  as soon as they are available. The fastest way to get your results is to activate your My Chart account. Instructions are located on the last page of this paperwork. If you have not heard from [place] regarding the results in 2 weeks, please contact this office.

## 2017-05-07 NOTE — Progress Notes (Signed)
Subjective:  By signing my name below, I, Holly Hays, attest that this documentation has been prepared under the direction and in the presence of Shade Flood, MD Electronically Signed: Charline Bills, ED Scribe 05/07/2017 at 3:03 PM.   Patient ID: Holly Hays, female    DOB: 18-Jul-1965, 51 y.o.   MRN: [id number]  Chief Complaint  Patient presents with  . Medication Refill    famotidine, hydroxyzine, levothyroxine, lisinopril-hctz, metoprolol, triamcinolone, valacyclovir  . URI    x 1 week, tried nyquil day/night, and sudafed   HPI Holly Hays is a 51 y.o. female who presents to Primary Care at [facility] for multiple medication refills along with URI symptoms. Last seen 04/2016. Pt is fasting at this visit; last ate around 2 AM.   GERD Reportedly neg EDG a few yrs ago. Takes famotidine 20 mg bid with relief.   HTN BP at last visit 161/94. Continued on Lisinopril-HCTZ 20-25 mg 2 per day and Toprol XL 200 mg qd, however, pt reports that she has only been taking 1 per day. Pt ran out of medication 3 days ago. Reports BP on meds of 164/93 on 9/25 while at the optometrist. Denies cp, sob, light-headedness, dizziness, HA.  Hypothyroidism She had taken Levothyroxine 100 mcg qd but asked to decreased to 50 mcg due to low TSH in Dec with reccommended 3 month recheck for thyroid which has not been rechecked since last yr. Pt reports being compliant with medication.  Lab Results  Component Value Date   TSH 0.177 (L) 05/04/2016   H/o HSV Continued on Valtrex for suppression. Pos HSV 1 and 2 antibodies on testing last yr.   Hyperglycemia  Borderline elevated 106 last yr.   Hyperlipidemia Borderline elevated last yr. Recommended increase physical activity and diet changes.  Lab Results  Component Value Date   CHOL 183 05/04/2016   HDL 43 05/04/2016   LDLCALC 118 (H) 05/04/2016   TRIG 108 05/04/2016   CHOLHDL 4.3 05/04/2016   Lab Results  Component Value Date   ALT 37  (H) 05/04/2016   AST 34 05/04/2016   ALKPHOS 72 05/04/2016   BILITOT 0.5 05/04/2016   Wt Readings from Last 3 Encounters:  05/07/17 231 lb (104.8 kg)  05/04/16 222 lb 3.2 oz (100.8 kg)  11/02/15 219 lb (99.3 kg)   Hydroxyzine Refill Pt has been taking hydroxyzine nightly to assist with sleep. Reports difficulty falling asleep.   Triamcinolone Refill Pt is using cream topically bid for eczema. She is also using Eucerin cream. Denies flare-ups since starting cream daily.  URI Pt reports cold-like symptoms x 1 week, fever that has resolved, gradually improving cough, some sob earlier today but denies any at this time. Denies chest tightness, cp.   Patient Active Problem List   Diagnosis Date Noted  . Hypertension 08/06/2012  . Hypothyroidism 08/06/2012  . Thyroid goiter 08/06/2012   Past Medical History:  Diagnosis Date  . Allergy   . Hypertension   . Thyroid disease    Past Surgical History:  Procedure Laterality Date  . breast reduction     Allergies  Allergen Reactions  . Latex Itching and Rash   Prior to Admission medications   Medication Sig Start Date End Date Taking? Authorizing Provider  famotidine (PEPCID) 20 MG tablet Take 1 tablet (20 mg total) by mouth 2 (two) times daily. 05/04/16   Bing Neighbors, FNP  hydrOXYzine (ATARAX/VISTARIL) 25 MG tablet Take 0.5-1 tablets (12.5-25 mg total) by mouth  every 8 (eight) hours as needed for itching. 11/02/15   Trena PlattEnglish, Stephanie D, PA  levothyroxine (SYNTHROID, LEVOTHROID) 50 MCG tablet Take 2 tablets (100 mcg total) by mouth daily. 05/12/16   Bing NeighborsHarris, Kimberly S, FNP  lisinopril-hydrochlorothiazide (PRINZIDE,ZESTORETIC) 20-25 MG tablet Take 2 tablets by mouth daily. 05/04/16   Bing NeighborsHarris, Kimberly S, FNP  metoprolol (TOPROL XL) 200 MG 24 hr tablet Take 1 tablet (200 mg total) by mouth daily. 05/04/16   Bing NeighborsHarris, Kimberly S, FNP  triamcinolone cream (KENALOG) 0.1 % Apply 1 application topically 2 (two) times daily. 11/02/15   Trena PlattEnglish,  Stephanie D, PA  valACYclovir (VALTREX) 500 MG tablet TAKE ONE CAPLET BY MOUTH ONCE DAILY 05/04/16   Bing NeighborsHarris, Kimberly S, FNP   Social History   Socioeconomic History  . Marital status: Married    Spouse name: Not on file  . Number of children: Not on file  . Years of education: Not on file  . Highest education level: Not on file  Social Needs  . Financial resource strain: Not on file  . Food insecurity - worry: Not on file  . Food insecurity - inability: Not on file  . Transportation needs - medical: Not on file  . Transportation needs - non-medical: Not on file  Occupational History  . Not on file  Tobacco Use  . Smoking status: Never Smoker  . Smokeless tobacco: Never Used  Substance and Sexual Activity  . Alcohol use: No  . Drug use: No  . Sexual activity: Yes  Other Topics Concern  . 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 Negative for fatigue and unexpected weight change.  Respiratory: Positive for cough (improving) and shortness of breath (resolved). Negative for chest tightness.   Cardiovascular: Negative for chest pain, palpitations and leg swelling.  Gastrointestinal: Negative for abdominal pain and blood in stool.  Neurological: Negative for dizziness, syncope, light-headedness and headaches.      Objective:   Physical Exam  Constitutional: She is oriented to person, place, and time. She appears well-developed and well-nourished. No distress.  HENT:  Head: Normocephalic and atraumatic.  Right Ear: Hearing, tympanic membrane, external ear and ear canal normal.  Left Ear: Hearing, tympanic membrane, external ear and ear canal normal.  Nose: Nose normal.  Mouth/Throat: Oropharynx is clear and moist. No oropharyngeal exudate.  Eyes: Conjunctivae and EOM are normal. Pupils are equal, round, and reactive to light.  Neck: Carotid bruit is not  present.  Cardiovascular: Normal rate, regular rhythm, normal heart sounds and intact distal pulses.  No murmur heard. Pulmonary/Chest: Effort normal and breath sounds normal. No respiratory distress. She has no wheezes. She has no rhonchi.  Abdominal: Soft. She exhibits no pulsatile midline mass. There is no tenderness.  Neurological: She is alert and oriented to person, place, and time.  Skin: Skin is warm and dry. No rash noted.  Psychiatric: She has a normal mood and affect. Her behavior is normal.  Vitals reviewed.  Vitals:   05/07/17 1428  BP: (!) 173/116  Pulse: 78  Resp: 17  Temp: 98.3 F (36.8 C)  TempSrc: Oral  SpO2: 97%  Weight: 231 lb (104.8 kg)  Height: 5\' 9"  (1.753 m)      Assessment & Plan:    Holly PoliCarolyn D Dumont is a 51 y.o. female Hypothyroidism, unspecified type - Plan: levothyroxine (SYNTHROID, LEVOTHROID) 50 MCG tablet, TSH  -  Check TSH on lower dose. Importance of ongoing follow-up/frequent testing discussed.  Hyperlipidemia, unspecified hyperlipidemia type - Plan: Comprehensive metabolic panel, Lipid panel  -Not currently on medication. Mild elevation prior, check labs.  Essential hypertension Hypertension, unspecified type - Plan: metoprolol (TOPROL XL) 200 MG 24 hr tablet  -Uncontrolled with medication nonadherence. Currently asymptomatic with above reading. Importance of ongoing follow-up and medication adherence discussed including short and long-term risks of uncontrolled hypertension.  -Even on medication, outside blood pressures too high. Increase lisinopril to 40 mg daily, continue 25 mg of HCTZ. Continue Toprol 200 mg daily. Could consider adding CCB or spironolactone if needed.  -Recheck in next 2 weeks for assessment of blood pressure control on this regimen, sooner if needed, and ER/911 precautions if such as new headaches, chest pain, shortness of breath, or focal weakness.   Gastroesophageal reflux disease, esophagitis presence not specified -  Plan: famotidine (PEPCID) 20 MG tablet  -Continue Pepcid as needed. Currently controlled  Hyperglycemia - Plan: Hemoglobin A1c  -Prior hyperglycemia, check A1c  Eczema - Plan: triamcinolone cream (KENALOG) 0.1 %  -Overall stable with triamcinolone cream and Eucerin. Refilled triamcinolone.  Insomnia, unspecified type - Plan: hydrOXYzine (ATARAX/VISTARIL) 25 MG tablet  -Hydroxyzine refilled for nighttime use if needed as currently tolerating  Herpes - Plan: valACYclovir (VALTREX) 500 MG tablet  -Valtrex refilled for suppressive use.  Upper respiratory infection  -Symptomatic care, but avoid decongestants, discussed Coricidin HBP may be safer with her hypertension. symptoms improving. RTC precautions if worsening.  Meds ordered this encounter  Medications  . famotidine (PEPCID) 20 MG tablet    Sig: Take 1 tablet (20 mg total) by mouth 2 (two) times daily.    Dispense:  180 tablet    Refill:  3  . levothyroxine (SYNTHROID, LEVOTHROID) 50 MCG tablet    Sig: Take 1 tablet (50 mcg total) by mouth daily.    Dispense:  90 tablet    Refill:  1  . metoprolol (TOPROL XL) 200 MG 24 hr tablet    Sig: Take 1 tablet (200 mg total) by mouth daily.    Dispense:  90 tablet    Refill:  1  . lisinopril-hydrochlorothiazide (ZESTORETIC) 20-12.5 MG tablet    Sig: Take 2 tablets by mouth daily.    Dispense:  180 tablet    Refill:  1  . hydrOXYzine (ATARAX/VISTARIL) 25 MG tablet    Sig: Take 0.5-1 tablets (12.5-25 mg total) by mouth at bedtime as needed for itching.    Dispense:  30 tablet    Refill:  0  . triamcinolone cream (KENALOG) 0.1 %    Sig: Apply 1 application topically 2 (two) times daily.    Dispense:  45 g    Refill:  3  . valACYclovir (VALTREX) 500 MG tablet    Sig: TAKE ONE CAPLET BY MOUTH ONCE DAILY    Dispense:  90 tablet    Refill:  3   Patient Instructions   Blood pressure medication increased to 2 pills each day of the new lisinopril HCTZ dose. No other change in  medications. Keep a record of your blood pressures outside of the visit and follow-up in 2 weeks.  No change in other medications for now. I will check some lab work today.  Coricidin HBP if needed for congestion or cough, saline nasal spray if needed for nasal congestion, see information on upper respiratory infections below. Return to the clinic or go to the nearest emergency room if any of your symptoms  worsen or new symptoms occur.   Upper Respiratory Infection, Adult Most upper respiratory infections (URIs) are caused by a virus. A URI affects the nose, throat, and upper air passages. The most common type of URI is often called "the common cold." Follow these instructions at home:  Take medicines only as told by your doctor.  Gargle warm saltwater or take cough drops to comfort your throat as told by your doctor.  Use a warm mist humidifier or inhale steam from a shower to increase air moisture. This may make it easier to breathe.  Drink enough fluid to keep your pee (urine) clear or pale yellow.  Eat soups and other clear broths.  Have a healthy diet.  Rest as needed.  Go back to work when your fever is gone or your doctor says it is okay. ? You may need to stay home longer to avoid giving your URI to others. ? You can also wear a face mask and wash your hands often to prevent spread of the virus.  Use your inhaler more if you have asthma.  Do not use any tobacco products, including cigarettes, chewing tobacco, or electronic cigarettes. If you need help quitting, ask your doctor. Contact a doctor if:  You are getting worse, not better.  Your symptoms are not helped by medicine.  You have chills.  You are getting more short of breath.  You have brown or red mucus.  You have yellow or brown discharge from your nose.  You have pain in your face, especially when you bend forward.  You have a fever.  You have puffy (swollen) neck glands.  You have pain while  swallowing.  You have white areas in the back of your throat. Get help right away if:  You have very bad or constant: ? Headache. ? Ear pain. ? Pain in your forehead, behind your eyes, and over your cheekbones (sinus pain). ? Chest pain.  You have long-lasting (chronic) lung disease and any of the following: ? Wheezing. ? Long-lasting cough. ? Coughing up blood. ? A change in your usual mucus.  You have a stiff neck.  You have changes in your: ? Vision. ? Hearing. ? Thinking. ? Mood. This information is not intended to replace advice given to you by your health care provider. Make sure you discuss any questions you have with your health care provider. Document Released: 10/31/2007 Document Revised: 01/15/2016 Document Reviewed: 08/19/2013 Elsevier Interactive Patient Education  2018 [employer].   IF you received an x-ray today, you will receive an invoice from [facility]. Please contact [facility] at [phone] with questions or concerns regarding your invoice.   IF you received labwork today, you will receive an invoice from [facility]. Please contact [facility] at [phone] with questions or concerns regarding your invoice.   Our billing staff will not be able to assist you with questions regarding bills from these companies.  You will be contacted with the lab results as soon as they are available. The fastest way to get your results is to activate your My Chart account. Instructions are located on the last page of this paperwork. If you have not heard from [place] regarding the results in 2 weeks, please contact this office.     I personally performed the services described in this documentation, which was scribed in my presence. The recorded information has been reviewed and considered for accuracy and completeness, addended by me as needed, and agree with information above.  Signed,  [name], MD Primary Care at [facility].  05/08/17 1:15 PM

## 2017-05-08 LAB — COMPREHENSIVE METABOLIC PANEL
ALBUMIN: 4.2 g/dL (ref 3.5–5.5)
ALK PHOS: 81 IU/L (ref 39–117)
ALT: 19 IU/L (ref 0–32)
AST: 19 IU/L (ref 0–40)
Albumin/Globulin Ratio: 1.2 (ref 1.2–2.2)
BUN/Creatinine Ratio: 10 (ref 9–23)
BUN: 9 mg/dL (ref 6–24)
Bilirubin Total: 0.8 mg/dL (ref 0.0–1.2)
CALCIUM: 9.9 mg/dL (ref 8.7–10.2)
CO2: 25 mmol/L (ref 20–29)
CREATININE: 0.91 mg/dL (ref 0.57–1.00)
Chloride: 102 mmol/L (ref 96–106)
GFR, EST AFRICAN AMERICAN: 86 mL/min/{1.73_m2} (ref >59)
GFR, EST NON AFRICAN AMERICAN: 74 mL/min/{1.73_m2} (ref >59)
GLUCOSE: 90 mg/dL (ref 65–99)
Globulin, Total: 3.6 g/dL (ref 1.5–4.5)
Potassium: 4 mmol/L (ref 3.5–5.2)
SODIUM: 141 mmol/L (ref 134–144)
TOTAL PROTEIN: 7.8 g/dL (ref 6.0–8.5)

## 2017-05-08 LAB — LIPID PANEL
Chol/HDL Ratio: 4 ratio (ref 0.0–4.4)
Cholesterol, Total: 192 mg/dL (ref 100–199)
HDL: 48 mg/dL (ref >39)
LDL CALC: 127 mg/dL — AB (ref 0–99)
Triglycerides: 84 mg/dL (ref 0–149)
VLDL CHOLESTEROL CAL: 17 mg/dL (ref 5–40)

## 2017-05-08 LAB — HEMOGLOBIN A1C
Est. average glucose Bld gHb Est-mCnc: 120 mg/dL
HEMOGLOBIN A1C: 5.8 % — AB (ref 4.8–5.6)

## 2017-05-08 LAB — TSH: TSH: 0.996 u[IU]/mL (ref 0.450–4.500)

## 2017-05-09 NOTE — Progress Notes (Signed)
Lab letter sent 

## 2017-05-30 ENCOUNTER — Encounter: Payer: Self-pay | Admitting: Family Medicine

## 2017-05-30 ENCOUNTER — Other Ambulatory Visit: Payer: Self-pay

## 2017-05-30 ENCOUNTER — Ambulatory Visit: Payer: Federal, State, Local not specified - PPO | Admitting: Family Medicine

## 2017-05-30 VITALS — BP 160/100 | HR 61 | Temp 98.9°F | Resp 16 | Ht 69.0 in | Wt 233.6 lb

## 2017-05-30 DIAGNOSIS — I10 Essential (primary) hypertension: Secondary | ICD-10-CM | POA: Diagnosis not present

## 2017-05-30 DIAGNOSIS — R7303 Prediabetes: Secondary | ICD-10-CM

## 2017-05-30 MED ORDER — AMLODIPINE BESYLATE 5 MG PO TABS: 5.0000 mg | ORAL_TABLET | Freq: Every day | ORAL | 1 refills | Status: DC

## 2017-05-30 NOTE — Progress Notes (Signed)
Subjective:  By signing my name below, I, Holly Hays, attest that this documentation has been prepared under the direction and in the presence of Holly Staggers, MD. Electronically Signed: Stann Hays, Scribe. 05/30/2017 , 3:10 PM .  Patient was seen in Room 12 .   Patient ID: Holly Hays, female    DOB: 22-Aug-1965, 51 y.o.   MRN: [id number] Chief Complaint  Patient presents with  . Follow-up    patient presents to office for follow up on blood pressure and labs   HPI Holly Hays is a 52 y.o. female Here for follow up.   HTN Her BP was uncontrolled at Dec office visit at 173/116. She had been non-adherent to medications at that time, as she had been off medications for 3 days. Her lisinopril-HCTZ was increased to 40-25mg  QD. She was continued on Toprol 200mg  QD. Her BP was 164/93 in Sept while on medications. She states her BP usually runs about 160s/90s.   She reports feeling "funny" this morning before going to work at 2:00AM; she called out of work at that time. She describes feeling fatigue with headache, lightheadedness, and dizziness. She had some improvement after drinking some water. She denies any weakness, slurred speech, chest tightness or blurry vision.   Lab Results  Component Value Date   CREATININE 0.91 05/07/2017   Hyperglycemia/pre-diabetes Lab Results  Component Value Date   HGBA1C 5.8 (H) 05/07/2017   Wt Readings from Last 3 Encounters:  05/30/17 233 lb 9.6 oz (106 kg)  05/07/17 231 lb (104.8 kg)  05/04/16 222 lb 3.2 oz (100.8 kg)   Body mass index is 34.5 kg/m.   Patient Active Problem List   Diagnosis Date Noted  . Hypertension 08/06/2012  . Hypothyroidism 08/06/2012  . Thyroid goiter 08/06/2012   Past Medical History:  Diagnosis Date  . Allergy   . Hypertension   . Thyroid disease    Past Surgical History:  Procedure Laterality Date  . breast reduction     Allergies  Allergen Reactions  . Latex Itching and Rash   Prior to  Admission medications   Medication Sig Start Date End Date Taking? Authorizing Provider  famotidine (PEPCID) 20 MG tablet Take 1 tablet (20 mg total) by mouth 2 (two) times daily. 05/07/17  Yes Shade Flood, MD  hydrOXYzine (ATARAX/VISTARIL) 25 MG tablet Take 0.5-1 tablets (12.5-25 mg total) by mouth at bedtime as needed for itching. 05/07/17  Yes Shade Flood, MD  levothyroxine (SYNTHROID, LEVOTHROID) 50 MCG tablet Take 1 tablet (50 mcg total) by mouth daily. 05/07/17  Yes Shade Flood, MD  lisinopril-hydrochlorothiazide (ZESTORETIC) 20-12.5 MG tablet Take 2 tablets by mouth daily. 05/07/17  Yes Shade Flood, MD  metoprolol (TOPROL XL) 200 MG 24 hr tablet Take 1 tablet (200 mg total) by mouth daily. 05/07/17  Yes Shade Flood, MD  triamcinolone cream (KENALOG) 0.1 % Apply 1 application topically 2 (two) times daily. 05/07/17  Yes Shade Flood, MD  valACYclovir (VALTREX) 500 MG tablet TAKE ONE CAPLET BY MOUTH ONCE DAILY 05/07/17  Yes Shade Flood, MD   Social History   Socioeconomic History  . Marital status: Married    Spouse name: Not on file  . Number of children: Not on file  . Years of education: Not on file  . Highest education level: Not on file  Social Needs  . Financial resource strain: Not on file  . Food insecurity - worry: Not on file  .  Food insecurity - inability: Not on file  . Transportation needs - medical: Not on file  . Transportation needs - non-medical: Not on file  Occupational History  . Not on file  Tobacco Use  . Smoking status: Never Smoker  . Smokeless tobacco: Never Used  Substance and Sexual Activity  . Alcohol use: No  . Drug use: No  . Sexual activity: Yes  Other Topics Concern  . Not on file  Social History Narrative   Married   Education: Automotive engineer   Exercise: Walking   Review of Systems  Constitutional: Positive for fatigue. Negative for unexpected weight change.  Eyes: Negative for visual disturbance.    Respiratory: Negative for chest tightness and shortness of breath.   Cardiovascular: Negative for chest pain, palpitations and leg swelling.  Gastrointestinal: Negative for abdominal pain and blood in stool.  Neurological: Positive for dizziness, light-headedness and headaches. Negative for syncope, facial asymmetry, speech difficulty and weakness.       Objective:   Physical Exam  Constitutional: She is oriented to person, place, and time. She appears well-developed and well-nourished.  HENT:  Head: Normocephalic and atraumatic.  Eyes: Conjunctivae and EOM are normal. Pupils are equal, round, and reactive to light.  Neck: Carotid bruit is not present.  Cardiovascular: Normal rate, regular rhythm, normal heart sounds and intact distal pulses.  Pulmonary/Chest: Effort normal and breath sounds normal.  Abdominal: Soft. She exhibits no pulsatile midline mass. There is no tenderness.  Neurological: She is alert and oriented to person, place, and time. She has normal strength. She displays a negative Romberg sign. Coordination normal.  No pronator drift  Skin: Skin is warm and dry.  Psychiatric: She has a normal mood and affect. Her behavior is normal.  Vitals reviewed.   Vitals:   05/30/17 1424  BP: (!) 160/102  Pulse: 61  Resp: 16  Temp: 98.9 F (37.2 C)  TempSrc: Oral  SpO2: 99%  Weight: 233 lb 9.6 oz (106 kg)  Height: 5\' 9"  (1.753 m)      Assessment & Plan:    Holly Hays is a 52 y.o. female Essential hypertension - Plan: Basic metabolic panel, amLODipine (NORVASC) 5 MG tablet  - Nonfocal neuro exam. Advised if any increasing headache or worsening feeling with nonspecific symptoms since the morning, to proceed to ER/911 precautions  - Add amlodipine 5 mg daily, continue same dose of lisinopril, HCTZ, Toprol. Recheck a BMP at higher dose of ACE inhibitor. Recheck 1 month.  Prediabetes  -Handout given including information on diet for weight loss with recheck levels  in 3-6 months  Meds ordered this encounter  Medications  . amLODipine (NORVASC) 5 MG tablet    Sig: Take 1 tablet (5 mg total) by mouth daily.    Dispense:  90 tablet    Refill:  1   Patient Instructions   Blood pressure still running high. Continue lisinopril/HCTZ 2 pills per day, and same dose of metoprolol for now. Add amlodipine 5 mg once per day. Let me know your blood pressure readings in the next few weeks, then follow-up in one month.  Make sure you drink plenty of fluids today. If any worsening headache, dizziness, or other worsening symptoms, proceed to emergency room.  See information below on blood sugar/prediabetes. Walking for exercise most days per week to help with weight loss that will also help blood sugar. Recheck that level in 3-6 months.   Prediabetes Prediabetes is the condition of having a blood sugar (blood  glucose) level that is higher than it should be, but not high enough for you to be diagnosed with type 2 diabetes. Having prediabetes puts you at risk for developing type 2 diabetes (type 2 diabetes mellitus). Prediabetes may be called impaired glucose tolerance or impaired fasting glucose. Prediabetes usually does not cause symptoms. Your health care provider can diagnose this condition with blood tests. You may be tested for prediabetes if you are overweight and if you have at least one other risk factor for prediabetes. Risk factors for prediabetes include:  Having a family member with type 2 diabetes.  Being overweight or obese.  Being older than age 50.  Being of American-Indian, African-American, Hispanic/Latino, or Asian/Pacific Islander descent.  Having an inactive (sedentary) lifestyle.  Having a history of gestational diabetes or polycystic ovarian syndrome (PCOS).  Having low levels of good cholesterol (HDL-C) or high levels of blood fats (triglycerides).  Having high blood pressure.  What is blood glucose and how is blood glucose  measured?  Blood glucose refers to the amount of glucose in your bloodstream. Glucose comes from eating foods that contain sugars and starches (carbohydrates) that the body breaks down into glucose. Your blood glucose level may be measured in mg/dL (milligrams per deciliter) or mmol/L (millimoles per liter).Your blood glucose may be checked with one or more of the following blood tests:  A fasting blood glucose (FBG) test. You will not be allowed to eat (you will fast) for at least 8 hours before a blood sample is taken. ? A normal range for FBG is 70-100 mg/dl (1.6-1.03.9-5.6 mmol/L).  An A1c (hemoglobin A1c) blood test. This test provides information about blood glucose control over the previous 2?3months.  An oral glucose tolerance test (OGTT). This test measures your blood glucose twice: ? After fasting. This is your baseline level. ? Two hours after you drink a beverage that contains glucose.  You may be diagnosed with prediabetes:  If your FBG is 100?125 mg/dL (9.6-0.45.6-6.9 mmol/L).  If your A1c level is 5.7?6.4%.  If your OGGT result is 140?199 mg/dL (5.4-097.8-11 mmol/L).  These blood tests may be repeated to confirm your diagnosis. What happens if blood glucose is too high? The pancreas produces a hormone (insulin) that helps move glucose from the bloodstream into cells. When cells in the body do not respond properly to insulin that the body makes (insulin resistance), excess glucose builds up in the blood instead of going into cells. As a result, high blood glucose (hyperglycemia) can develop, which can cause many complications. This is a symptom of prediabetes. What can happen if blood glucose stays higher than normal for a long time? Having high blood glucose for a long time is dangerous. Too much glucose in your blood can damage your nerves and blood vessels. Long-term damage can lead to complications from diabetes, which may include:  Heart disease.  Stroke.  Blindness.  Kidney  disease.  Depression.  Poor circulation in the feet and legs, which could lead to surgical removal (amputation) in severe cases.  How can prediabetes be prevented from turning into type 2 diabetes?  To help prevent type 2 diabetes, take the following actions:  Be physically active. ? Do moderate-intensity physical activity for at least 30 minutes on at least 5 days of the week, or as much as told by your health care provider. This could be brisk walking, biking, or water aerobics. ? Ask your health care provider what activities are safe for you. A mix of physical activities  may be best, such as walking, swimming, cycling, and strength training.  Lose weight as told by your health care provider. ? Losing 5-7% of your body weight can reverse insulin resistance. ? Your health care provider can determine how much weight loss is best for you and can help you lose weight safely.  Follow a healthy meal plan. This includes eating lean proteins, complex carbohydrates, fresh fruits and vegetables, low-fat dairy products, and healthy fats. ? Follow instructions from your health care provider about eating or drinking restrictions. ? Make an appointment to see a diet and nutrition specialist (registered dietitian) to help you create a healthy eating plan that is right for you.  Do not smoke or use any tobacco products, such as cigarettes, chewing tobacco, and e-cigarettes. If you need help quitting, ask your health care provider.  Take over-the-counter and prescription medicines as told by your health care provider. You may be prescribed medicines that help lower the risk of type 2 diabetes.  This information is not intended to replace advice given to you by your health care provider. Make sure you discuss any questions you have with your health care provider. Document Released: 09/05/2015 Document Revised: 10/20/2015 Document Reviewed: 07/05/2015 Elsevier Interactive Patient Education  2018 [employer].  Prediabetes Eating Plan Prediabetes-also called impaired glucose tolerance or impaired fasting glucose-is a condition that causes blood sugar (blood glucose) levels to be higher than normal. Following a healthy diet can help to keep prediabetes under control. It can also help to lower the risk of type 2 diabetes and heart disease, which are increased in people who have prediabetes. Along with regular exercise, a healthy diet:  Promotes weight loss.  Helps to control blood sugar levels.  Helps to improve the way that the body uses insulin.  What do I need to know about this eating plan?  Use the glycemic index (GI) to plan your meals. The index tells you how quickly a food will raise your blood sugar. Choose low-GI foods. These foods take a longer time to raise blood sugar.  Pay close attention to the amount of carbohydrates in the food that you eat. Carbohydrates increase blood sugar levels.  Keep track of how many calories you take in. Eating the right amount of calories will help you to achieve a healthy weight. Losing about 7 percent of your starting weight can help to prevent type 2 diabetes.  You may want to follow a Mediterranean diet. This diet includes a lot of vegetables, lean meats or fish, whole grains, fruits, and healthy oils and fats. What foods can I eat? Grains Whole grains, such as whole-wheat or whole-grain breads, crackers, cereals, and pasta. Unsweetened oatmeal. Bulgur. Barley. Quinoa. Brown rice. Corn or whole-wheat flour tortillas or taco shells. Vegetables Lettuce. Spinach. Peas. Beets. Cauliflower. Cabbage. Broccoli. Carrots. Tomatoes. Squash. Eggplant. Herbs. Peppers. Onions. Cucumbers. Brussels sprouts. Fruits Berries. Bananas. Apples. Oranges. Grapes. Papaya. Mango. Pomegranate. Kiwi. Grapefruit. Cherries. Meats and Other Protein Sources Seafood. Lean meats, such as chicken and [place] or lean cuts of pork and beef. Tofu. Eggs. Nuts. Beans. Dairy Low-fat  or fat-free dairy products, such as yogurt, cottage cheese, and cheese. Beverages Water. Tea. Coffee. Sugar-free or diet soda. Seltzer water. Milk. Milk alternatives, such as soy or almond milk. Condiments Mustard. Relish. Low-fat, low-sugar ketchup. Low-fat, low-sugar barbecue sauce. Low-fat or fat-free mayonnaise. Sweets and Desserts Sugar-free or low-fat pudding. Sugar-free or low-fat ice cream and other frozen treats. Fats and Oils Avocado. Walnuts. Olive oil. The items  listed above may not be a complete list of recommended foods or beverages. Contact your dietitian for more options. What foods are not recommended? Grains Refined white flour and flour products, such as bread, pasta, snack foods, and cereals. Beverages Sweetened drinks, such as sweet iced tea and soda. Sweets and Desserts Baked goods, such as cake, cupcakes, pastries, cookies, and cheesecake. The items listed above may not be a complete list of foods and beverages to avoid. Contact your dietitian for more information. This information is not intended to replace advice given to you by your health care provider. Make sure you discuss any questions you have with your health care provider. Document Released: 09/28/2014 Document Revised: 10/20/2015 Document Reviewed: 06/09/2014 Elsevier Interactive Patient Education  2017 [employer].   IF you received an x-ray today, you will receive an invoice from [facility]. Please contact [facility] at [phone] with questions or concerns regarding your invoice.   IF you received labwork today, you will receive an invoice from [facility]. Please contact [facility] at [phone] with questions or concerns regarding your invoice.   Our billing staff will not be able to assist you with questions regarding bills from these companies.  You will be contacted with the lab results as soon as they are available. The fastest way to get your results is to activate your My  Chart account. Instructions are located on the last page of this paperwork. If you have not heard from [place] regarding the results in 2 weeks, please contact this office.    We recommend that you schedule a mammogram for breast cancer screening. Typically, you do not need a referral to do this. Please contact a local imaging center to schedule your mammogram.  [facility] - [phone]  *ask for the Radiology Department [facility]) - [phone] or [phone]  [facility] - [phone] [facility] - [phone] [facility] - [phone]  *ask for the Radiology Department [facility] - [phone]  *ask for the Radiology Department [facility] - [phone]  *ask for the Mammography Department [facility] - [phone]  I personally performed the services described in this documentation, which was scribed in my presence. The recorded information has been reviewed and considered for accuracy and completeness, addended by me as needed, and agree with information above.  Signed,   Holly Staggers, MD [facility].  05/31/17 3:33 PM

## 2017-05-30 NOTE — Patient Instructions (Addendum)
Blood pressure still running high. Continue lisinopril/HCTZ 2 pills per day, and same dose of metoprolol for now. Add amlodipine 5 mg once per day. Let me know your blood pressure readings in the next few weeks, then follow-up in one month.  Make sure you drink plenty of fluids today. If any worsening headache, dizziness, or other worsening symptoms, proceed to emergency room.  See information below on blood sugar/prediabetes. Walking for exercise most days per week to help with weight loss that will also help blood sugar. Recheck that level in 3-6 months.   Prediabetes Prediabetes is the condition of having a blood sugar (blood glucose) level that is higher than it should be, but not high enough for you to be diagnosed with type 2 diabetes. Having prediabetes puts you at risk for developing type 2 diabetes (type 2 diabetes mellitus). Prediabetes may be called impaired glucose tolerance or impaired fasting glucose. Prediabetes usually does not cause symptoms. Your health care provider can diagnose this condition with blood tests. You may be tested for prediabetes if you are overweight and if you have at least one other risk factor for prediabetes. Risk factors for prediabetes include:  Having a family member with type 2 diabetes.  Being overweight or obese.  Being older than age 68.  Being of American-Indian, African-American, Hispanic/Latino, or Asian/Pacific Islander descent.  Having an inactive (sedentary) lifestyle.  Having a history of gestational diabetes or polycystic ovarian syndrome (PCOS).  Having low levels of good cholesterol (HDL-C) or high levels of blood fats (triglycerides).  Having high blood pressure.  What is blood glucose and how is blood glucose measured?  Blood glucose refers to the amount of glucose in your bloodstream. Glucose comes from eating foods that contain sugars and starches (carbohydrates) that the body breaks down into glucose. Your blood glucose level  may be measured in mg/dL (milligrams per deciliter) or mmol/L (millimoles per liter).Your blood glucose may be checked with one or more of the following blood tests:  A fasting blood glucose (FBG) test. You will not be allowed to eat (you will fast) for at least 8 hours before a blood sample is taken. ? A normal range for FBG is 70-100 mg/dl (1.6-1.0 mmol/L).  An A1c (hemoglobin A1c) blood test. This test provides information about blood glucose control over the previous 2?3months.  An oral glucose tolerance test (OGTT). This test measures your blood glucose twice: ? After fasting. This is your baseline level. ? Two hours after you drink a beverage that contains glucose.  You may be diagnosed with prediabetes:  If your FBG is 100?125 mg/dL (9.6-0.4 mmol/L).  If your A1c level is 5.7?6.4%.  If your OGGT result is 140?199 mg/dL (5.4-09 mmol/L).  These blood tests may be repeated to confirm your diagnosis. What happens if blood glucose is too high? The pancreas produces a hormone (insulin) that helps move glucose from the bloodstream into cells. When cells in the body do not respond properly to insulin that the body makes (insulin resistance), excess glucose builds up in the blood instead of going into cells. As a result, high blood glucose (hyperglycemia) can develop, which can cause many complications. This is a symptom of prediabetes. What can happen if blood glucose stays higher than normal for a long time? Having high blood glucose for a long time is dangerous. Too much glucose in your blood can damage your nerves and blood vessels. Long-term damage can lead to complications from diabetes, which may include:  Heart disease.  Stroke.  Blindness.  Kidney disease.  Depression.  Poor circulation in the feet and legs, which could lead to surgical removal (amputation) in severe cases.  How can prediabetes be prevented from turning into type 2 diabetes?  To help prevent type 2  diabetes, take the following actions:  Be physically active. ? Do moderate-intensity physical activity for at least 30 minutes on at least 5 days of the week, or as much as told by your health care provider. This could be brisk walking, biking, or water aerobics. ? Ask your health care provider what activities are safe for you. A mix of physical activities may be best, such as walking, swimming, cycling, and strength training.  Lose weight as told by your health care provider. ? Losing 5-7% of your body weight can reverse insulin resistance. ? Your health care provider can determine how much weight loss is best for you and can help you lose weight safely.  Follow a healthy meal plan. This includes eating lean proteins, complex carbohydrates, fresh fruits and vegetables, low-fat dairy products, and healthy fats. ? Follow instructions from your health care provider about eating or drinking restrictions. ? Make an appointment to see a diet and nutrition specialist (registered dietitian) to help you create a healthy eating plan that is right for you.  Do not smoke or use any tobacco products, such as cigarettes, chewing tobacco, and e-cigarettes. If you need help quitting, ask your health care provider.  Take over-the-counter and prescription medicines as told by your health care provider. You may be prescribed medicines that help lower the risk of type 2 diabetes.  This information is not intended to replace advice given to you by your health care provider. Make sure you discuss any questions you have with your health care provider. Document Released: 09/05/2015 Document Revised: 10/20/2015 Document Reviewed: 07/05/2015 Elsevier Interactive Patient Education  2018 [employer].  Prediabetes Eating Plan Prediabetes-also called impaired glucose tolerance or impaired fasting glucose-is a condition that causes blood sugar (blood glucose) levels to be higher than normal. Following a healthy diet can  help to keep prediabetes under control. It can also help to lower the risk of type 2 diabetes and heart disease, which are increased in people who have prediabetes. Along with regular exercise, a healthy diet:  Promotes weight loss.  Helps to control blood sugar levels.  Helps to improve the way that the body uses insulin.  What do I need to know about this eating plan?  Use the glycemic index (GI) to plan your meals. The index tells you how quickly a food will raise your blood sugar. Choose low-GI foods. These foods take a longer time to raise blood sugar.  Pay close attention to the amount of carbohydrates in the food that you eat. Carbohydrates increase blood sugar levels.  Keep track of how many calories you take in. Eating the right amount of calories will help you to achieve a healthy weight. Losing about 7 percent of your starting weight can help to prevent type 2 diabetes.  You may want to follow a Mediterranean diet. This diet includes a lot of vegetables, lean meats or fish, whole grains, fruits, and healthy oils and fats. What foods can I eat? Grains Whole grains, such as whole-wheat or whole-grain breads, crackers, cereals, and pasta. Unsweetened oatmeal. Bulgur. Barley. Quinoa. Brown rice. Corn or whole-wheat flour tortillas or taco shells. Vegetables Lettuce. Spinach. Peas. Beets. Cauliflower. Cabbage. Broccoli. Carrots. Tomatoes. Squash. Eggplant. Herbs. Peppers. Onions. Cucumbers. Brussels sprouts.  Fruits Berries. Bananas. Apples. Oranges. Grapes. Papaya. Mango. Pomegranate. Kiwi. Grapefruit. Cherries. Meats and Other Protein Sources Seafood. Lean meats, such as chicken and [place] or lean cuts of pork and beef. Tofu. Eggs. Nuts. Beans. Dairy Low-fat or fat-free dairy products, such as yogurt, cottage cheese, and cheese. Beverages Water. Tea. Coffee. Sugar-free or diet soda. Seltzer water. Milk. Milk alternatives, such as soy or almond milk. Condiments Mustard. Relish.  Low-fat, low-sugar ketchup. Low-fat, low-sugar barbecue sauce. Low-fat or fat-free mayonnaise. Sweets and Desserts Sugar-free or low-fat pudding. Sugar-free or low-fat ice cream and other frozen treats. Fats and Oils Avocado. Walnuts. Olive oil. The items listed above may not be a complete list of recommended foods or beverages. Contact your dietitian for more options. What foods are not recommended? Grains Refined white flour and flour products, such as bread, pasta, snack foods, and cereals. Beverages Sweetened drinks, such as sweet iced tea and soda. Sweets and Desserts Baked goods, such as cake, cupcakes, pastries, cookies, and cheesecake. The items listed above may not be a complete list of foods and beverages to avoid. Contact your dietitian for more information. This information is not intended to replace advice given to you by your health care provider. Make sure you discuss any questions you have with your health care provider. Document Released: 09/28/2014 Document Revised: 10/20/2015 Document Reviewed: 06/09/2014 Elsevier Interactive Patient Education  2017 [employer]   IF you received an x-ray today, you will receive an invoice from [facility]. Please contact [facility] at [phone] with questions or concerns regarding your invoice.   IF you received labwork today, you will receive an invoice from [facility]. Please contact [facility] at [phone] with questions or concerns regarding your invoice.   Our billing staff will not be able to assist you with questions regarding bills from these companies.  You will be contacted with the lab results as soon as they are available. The fastest way to get your results is to activate your My Chart account. Instructions are located on the last page of this paperwork. If you have not heard from us regarding the results in 2 weeks, please contact this office.    We recommend that you schedule a mammogram for breast  cancer screening. Typically, you do not need a referral to do this. Please contact a local imaging center to schedule your mammogram.  [facility] - [phone]  *ask for the Radiology Department [facility] - [phone] or [phone]  [facility] - [phone] [facility] - [phone] [facility] - [phone]  *ask for the Radiology Department [facility] - [phone]  *ask for the Radiology Department [facility] - [phone]) [phone]  *ask for the Mammography Department [facility] - [phone]) [phone]

## 2017-05-31 LAB — BASIC METABOLIC PANEL
BUN/Creatinine Ratio: 12 (ref 9–23)
BUN: 12 mg/dL (ref 6–24)
CALCIUM: 9.3 mg/dL (ref 8.7–10.2)
CHLORIDE: 103 mmol/L (ref 96–106)
CO2: 20 mmol/L (ref 20–29)
Creatinine, Ser: 0.99 mg/dL (ref 0.57–1.00)
GFR calc Af Amer: 77 mL/min/{1.73_m2} (ref >59)
GFR, EST NON AFRICAN AMERICAN: 67 mL/min/{1.73_m2} (ref >59)
Glucose: 112 mg/dL — ABNORMAL HIGH (ref 65–99)
Potassium: 4.1 mmol/L (ref 3.5–5.2)
Sodium: 138 mmol/L (ref 134–144)

## 2017-06-01 ENCOUNTER — Encounter: Payer: Self-pay | Admitting: Radiology

## 2017-07-02 ENCOUNTER — Ambulatory Visit: Payer: Federal, State, Local not specified - PPO | Admitting: Family Medicine

## 2017-07-30 ENCOUNTER — Ambulatory Visit: Payer: Federal, State, Local not specified - PPO | Admitting: Family Medicine

## 2017-08-05 DIAGNOSIS — E039 Hypothyroidism, unspecified: Secondary | ICD-10-CM | POA: Diagnosis not present

## 2017-08-05 DIAGNOSIS — Z0000 Encounter for general adult medical examination without abnormal findings: Secondary | ICD-10-CM | POA: Diagnosis not present

## 2017-08-20 DIAGNOSIS — E042 Nontoxic multinodular goiter: Secondary | ICD-10-CM | POA: Diagnosis not present

## 2017-08-20 DIAGNOSIS — E049 Nontoxic goiter, unspecified: Secondary | ICD-10-CM | POA: Diagnosis not present

## 2017-12-06 DIAGNOSIS — F4323 Adjustment disorder with mixed anxiety and depressed mood: Secondary | ICD-10-CM | POA: Diagnosis not present

## 2018-01-04 ENCOUNTER — Other Ambulatory Visit: Payer: Self-pay | Admitting: Family Medicine

## 2018-01-04 DIAGNOSIS — I10 Essential (primary) hypertension: Secondary | ICD-10-CM

## 2018-01-24 DIAGNOSIS — Z1231 Encounter for screening mammogram for malignant neoplasm of breast: Secondary | ICD-10-CM | POA: Diagnosis not present

## 2018-04-03 ENCOUNTER — Encounter: Payer: Self-pay | Admitting: Emergency Medicine

## 2018-04-03 ENCOUNTER — Ambulatory Visit: Payer: Federal, State, Local not specified - PPO | Admitting: Emergency Medicine

## 2018-04-03 ENCOUNTER — Other Ambulatory Visit: Payer: Self-pay

## 2018-04-03 VITALS — BP 170/96 | HR 50 | Temp 98.6°F | Resp 16 | Ht 67.25 in | Wt 238.6 lb

## 2018-04-03 DIAGNOSIS — J029 Acute pharyngitis, unspecified: Secondary | ICD-10-CM

## 2018-04-03 MED ORDER — AZITHROMYCIN 250 MG PO TABS: ORAL_TABLET | ORAL | 0 refills | Status: DC

## 2018-04-03 NOTE — Progress Notes (Signed)
Hartford Poli 52 y.o.   Chief Complaint  Patient presents with  . Sore Throat    x 3 days - hurts to swallow    HISTORY OF PRESENT ILLNESS: This is a 52 y.o. female complaining of sore throat for 3 days with painful swallowing.  Denies fever or chills.  No other significant symptoms.  HPI   Prior to Admission medications   Medication Sig Start Date End Date Taking? Authorizing Provider  amLODipine (NORVASC) 5 MG tablet Take 1 tablet (5 mg total) by mouth daily. 05/30/17  Yes Shade Flood, MD  famotidine (PEPCID) 20 MG tablet Take 1 tablet (20 mg total) by mouth 2 (two) times daily. 05/07/17  Yes Shade Flood, MD  hydrOXYzine (ATARAX/VISTARIL) 25 MG tablet Take 0.5-1 tablets (12.5-25 mg total) by mouth at bedtime as needed for itching. 05/07/17  Yes Shade Flood, MD  levothyroxine (SYNTHROID, LEVOTHROID) 50 MCG tablet Take 1 tablet (50 mcg total) by mouth daily. 05/07/17  Yes Shade Flood, MD  lisinopril-hydrochlorothiazide (PRINZIDE,ZESTORETIC) 20-12.5 MG tablet TAKE 2 TABLETS BY MOUTH ONCE DAILY 01/06/18  Yes Shade Flood, MD  metoprolol (TOPROL XL) 200 MG 24 hr tablet Take 1 tablet (200 mg total) by mouth daily. 05/07/17  Yes Shade Flood, MD  triamcinolone cream (KENALOG) 0.1 % Apply 1 application topically 2 (two) times daily. 05/07/17  Yes Shade Flood, MD  valACYclovir (VALTREX) 500 MG tablet TAKE ONE CAPLET BY MOUTH ONCE DAILY 05/07/17  Yes Shade Flood, MD  metoprolol (TOPROL-XL) 200 MG 24 hr tablet TAKE ONE TABLET BY MOUTH DAILY 01/06/18   Shade Flood, MD    Allergies  Allergen Reactions  . Latex Itching and Rash    Patient Active Problem List   Diagnosis Date Noted  . Hypertension 08/06/2012  . Hypothyroidism 08/06/2012  . Thyroid goiter 08/06/2012    Past Medical History:  Diagnosis Date  . Allergy   . Hypertension   . Thyroid disease     Past Surgical History:  Procedure Laterality Date  . breast reduction       Social History   Socioeconomic History  . Marital status: Married    Spouse name: Not on file  . Number of children: Not on file  . Years of education: Not on file  . Highest education level: Not on file  Occupational History  . Not on file  Social Needs  . Financial resource strain: Not on file  . Food insecurity:    Worry: Not on file    Inability: Not on file  . Transportation needs:    Medical: Not on file    Non-medical: Not on file  Tobacco Use  . Smoking status: Never Smoker  . Smokeless tobacco: Never Used  Substance and Sexual Activity  . Alcohol use: No  . Drug use: No  . Sexual activity: Yes  Lifestyle  . Physical activity:    Days per week: Not on file    Minutes per session: Not on file  . Stress: Not on file  Relationships  . Social connections:    Talks on phone: Not on file    Gets together: Not on file    Attends religious service: Not on file    Active member of club or organization: Not on file    Attends meetings of clubs or organizations: Not on file    Relationship status: Not on file  . Intimate partner violence:    Fear of  current or ex partner: Not on file    Emotionally abused: Not on file    Physically abused: Not on file    Forced sexual activity: Not on file  Other Topics Concern  . Not on file  Social History Narrative   Married   Education: Automotive engineer   Exercise: Walking    Family History  Problem Relation Age of Onset  . Diabetes Mother   . Stroke Father   . Diabetes Sister      Review of Systems  Constitutional: Negative.  Negative for chills and fever.  HENT: Positive for sore throat.   Eyes: Negative.  Negative for discharge and redness.  Respiratory: Negative.  Negative for cough and shortness of breath.   Cardiovascular: Negative.  Negative for chest pain and palpitations.  Gastrointestinal: Negative.  Negative for abdominal pain, diarrhea, nausea and vomiting.  Genitourinary: Negative.   Skin: Negative.   Negative for rash.  Neurological: Negative for dizziness and headaches.  Endo/Heme/Allergies: Negative.   All other systems reviewed and are negative.   Vitals:   04/03/18 1631  BP: (!) 170/96  Pulse: (!) 50  Resp: 16  Temp: 98.6 F (37 C)  SpO2: 97%    Physical Exam  Constitutional: She is oriented to person, place, and time. She appears well-developed and well-nourished.  HENT:  Head: Normocephalic and atraumatic.  Right Ear: External ear normal.  Left Ear: External ear normal.  Nose: Nose normal.  Mouth/Throat: Uvula is midline. No uvula swelling. Posterior oropharyngeal erythema present. No oropharyngeal exudate, posterior oropharyngeal edema or tonsillar abscesses.  Eyes: Pupils are equal, round, and reactive to light. Conjunctivae and EOM are normal.  Neck: Normal range of motion. Neck supple. No JVD present.  Cardiovascular: Normal rate, regular rhythm and normal heart sounds.  Pulmonary/Chest: Effort normal and breath sounds normal.  Abdominal: Soft. There is no tenderness.  Musculoskeletal: Normal range of motion.  Lymphadenopathy:    She has cervical adenopathy.  Neurological: She is alert and oriented to person, place, and time. No sensory deficit. She exhibits normal muscle tone.  Skin: Skin is warm and dry. Capillary refill takes less than 2 seconds.  Psychiatric: She has a normal mood and affect. Her behavior is normal.  Vitals reviewed.  A total of 25 minutes was spent in the room with the patient, greater than 50% of which was in counseling/coordination of care regarding differential diagnosis, treatment, medications, and need for follow-up if no better or worse.   ASSESSMENT & PLAN: Holly Hays was seen today for sore throat.  Diagnoses and all orders for this visit:  Acute pharyngitis, unspecified etiology -     azithromycin (ZITHROMAX) 250 MG tablet; Sig as indicated  Sore throat      Patient Instructions       If you have lab work done today  you will be contacted with your lab results within the next 2 weeks.  If you have not heard from [place] then please contact us. The fastest way to get your results is to register for My Chart.   IF you received an x-ray today, you will receive an invoice from [facility]. Please contact [facility] at [phone] with questions or concerns regarding your invoice.   IF you received labwork today, you will receive an invoice from [facility]. Please contact [facility] at [phone] with questions or concerns regarding your invoice.   Our billing staff will not be able to assist you with questions regarding bills from these companies.  You  will be contacted with the lab results as soon as they are available. The fastest way to get your results is to activate your My Chart account. Instructions are located on the last page of this paperwork. If you have not heard from [place] regarding the results in 2 weeks, please contact this office.     Pharyngitis Pharyngitis is a sore throat (pharynx). There is redness, pain, and swelling of your throat. Follow these instructions at home:  Drink enough fluids to keep your pee (urine) clear or pale yellow.  Only take medicine as told by your doctor. ? You may get sick again if you do not take medicine as told. Finish your medicines, even if you start to feel better. ? Do not take aspirin.  Rest.  Rinse your mouth (gargle) with salt water ( tsp of salt per 1 qt of water) every 1-2 hours. This will help the pain.  If you are not at risk for choking, you can suck on hard candy or sore throat lozenges. Contact a doctor if:  You have large, tender lumps on your neck.  You have a rash.  You cough up green, yellow-brown, or bloody spit. Get help right away if:  You have a stiff neck.  You drool or cannot swallow liquids.  You throw up (vomit) or are not able to keep medicine or liquids down.  You have very bad pain that does not go away  with medicine.  You have problems breathing (not from a stuffy nose). This information is not intended to replace advice given to you by your health care provider. Make sure you discuss any questions you have with your health care provider. Document Released: 10/31/2007 Document Revised: 10/20/2015 Document Reviewed: 01/19/2013 Elsevier Interactive Patient Education  2017 Elsevier Inc.    Edwina Barth, MD [facility]

## 2018-04-03 NOTE — Patient Instructions (Addendum)
If you have lab work done today you will be contacted with your lab results within the next 2 weeks.  If you have not heard from us then please contact us. The fastest way to get your results is to register for My Chart.   IF you received an x-ray today, you will receive an invoice from [facility]. Please contact [facility] at [phone] with questions or concerns regarding your invoice.   IF you received labwork today, you will receive an invoice from [facility]. Please contact [facility] at [phone] with questions or concerns regarding your invoice.   Our billing staff will not be able to assist you with questions regarding bills from these companies.  You will be contacted with the lab results as soon as they are available. The fastest way to get your results is to activate your My Chart account. Instructions are located on the last page of this paperwork. If you have not heard from us regarding the results in 2 weeks, please contact this office.      Pharyngitis Pharyngitis is a sore throat (pharynx). There is redness, pain, and swelling of your throat. Follow these instructions at home:  Drink enough fluids to keep your pee (urine) clear or pale yellow.  Only take medicine as told by your doctor. ? You may get sick again if you do not take medicine as told. Finish your medicines, even if you start to feel better. ? Do not take aspirin.  Rest.  Rinse your mouth (gargle) with salt water ( tsp of salt per 1 qt of water) every 1-2 hours. This will help the pain.  If you are not at risk for choking, you can suck on hard candy or sore throat lozenges. Contact a doctor if:  You have large, tender lumps on your neck.  You have a rash.  You cough up green, yellow-brown, or bloody spit. Get help right away if:  You have a stiff neck.  You drool or cannot swallow liquids.  You throw up (vomit) or are not able to keep medicine or liquids down.  You have  very bad pain that does not go away with medicine.  You have problems breathing (not from a stuffy nose). This information is not intended to replace advice given to you by your health care provider. Make sure you discuss any questions you have with your health care provider. Document Released: 10/31/2007 Document Revised: 10/20/2015 Document Reviewed: 01/19/2013 Elsevier Interactive Patient Education  2017 Elsevier Inc.  

## 2018-05-11 ENCOUNTER — Other Ambulatory Visit: Payer: Self-pay | Admitting: Family Medicine

## 2018-05-11 DIAGNOSIS — B009 Herpesviral infection, unspecified: Secondary | ICD-10-CM

## 2018-05-12 NOTE — Telephone Encounter (Signed)
Please advise 

## 2018-05-12 NOTE — Telephone Encounter (Signed)
Requested medication (Hays) are due for refill today: Yes  Requested medication (Hays) are on the active medication list: Yes  Last refill:  05/07/17  Future visit scheduled: No  Notes to clinic:  Prescription has expired.    Requested Prescriptions  Pending Prescriptions Disp Refills   valACYclovir (VALTREX) 500 MG tablet [Pharmacy Med Name: ValACYclovir 500MG   TAB] 90 tablet 0    Sig: TAKE 1 TABLET BY MOUTH ONCE DAILY     Antimicrobials:  Antiviral Agents - Anti-Herpetic Passed - 05/11/2018  2:33 PM      Passed - Valid encounter within last 12 months    Recent Outpatient Visits          1 month ago Acute pharyngitis, unspecified etiology   [facility] Holly Jose, MD   11 months ago Essential hypertension   [facility], Holly PartridgeJeffrey R, MD   1 year ago Hypothyroidism, unspecified type   [facility], Holly PartridgeJeffrey R, MD   2 years ago Gastroesophageal reflux disease without esophagitis   [facility], Holly PickKimberly S, FNP   2 years ago    [facility], Holly PartridgeJeffrey R, MD

## 2018-06-17 DIAGNOSIS — H6123 Impacted cerumen, bilateral: Secondary | ICD-10-CM | POA: Diagnosis not present

## 2018-09-21 ENCOUNTER — Other Ambulatory Visit: Payer: Self-pay | Admitting: Family Medicine

## 2018-09-21 DIAGNOSIS — I10 Essential (primary) hypertension: Secondary | ICD-10-CM

## 2018-09-21 DIAGNOSIS — B009 Herpesviral infection, unspecified: Secondary | ICD-10-CM

## 2018-09-21 NOTE — Telephone Encounter (Signed)
Can the patient receive refills on this medication?

## 2018-10-28 ENCOUNTER — Other Ambulatory Visit: Payer: Self-pay

## 2018-10-28 ENCOUNTER — Ambulatory Visit: Payer: Federal, State, Local not specified - PPO | Admitting: Emergency Medicine

## 2018-10-28 ENCOUNTER — Encounter: Payer: Self-pay | Admitting: Emergency Medicine

## 2018-10-28 VITALS — BP 140/88 | HR 63 | Temp 98.4°F | Resp 18

## 2018-10-28 DIAGNOSIS — G4700 Insomnia, unspecified: Secondary | ICD-10-CM

## 2018-10-28 DIAGNOSIS — L309 Dermatitis, unspecified: Secondary | ICD-10-CM

## 2018-10-28 DIAGNOSIS — B009 Herpesviral infection, unspecified: Secondary | ICD-10-CM

## 2018-10-28 DIAGNOSIS — I10 Essential (primary) hypertension: Secondary | ICD-10-CM

## 2018-10-28 DIAGNOSIS — E039 Hypothyroidism, unspecified: Secondary | ICD-10-CM

## 2018-10-28 MED ORDER — AMLODIPINE BESYLATE 5 MG PO TABS: 5.0000 mg | ORAL_TABLET | Freq: Every day | ORAL | 1 refills | Status: DC

## 2018-10-28 MED ORDER — METOPROLOL SUCCINATE ER 200 MG PO TB24: 200.0000 mg | ORAL_TABLET | Freq: Every day | ORAL | 3 refills | Status: AC

## 2018-10-28 MED ORDER — HYDROXYZINE HCL 25 MG PO TABS: 12.5000 mg | ORAL_TABLET | Freq: Every evening | ORAL | 0 refills | Status: DC | PRN

## 2018-10-28 MED ORDER — TRIAMCINOLONE ACETONIDE 0.1 % EX CREA: 1.0000 "application " | TOPICAL_CREAM | Freq: Two times a day (BID) | CUTANEOUS | 3 refills | Status: DC

## 2018-10-28 MED ORDER — LISINOPRIL-HYDROCHLOROTHIAZIDE 20-12.5 MG PO TABS: 2.0000 | ORAL_TABLET | Freq: Every day | ORAL | 3 refills | Status: AC

## 2018-10-28 MED ORDER — LEVOTHYROXINE SODIUM 50 MCG PO TABS: 50.0000 ug | ORAL_TABLET | Freq: Every day | ORAL | 1 refills | Status: DC

## 2018-10-28 MED ORDER — VALACYCLOVIR HCL 500 MG PO TABS: 500.0000 mg | ORAL_TABLET | Freq: Every day | ORAL | 0 refills | Status: DC

## 2018-10-28 NOTE — Progress Notes (Signed)
Holly Hays 53 y.o.   Chief Complaint  Patient presents with   Medication Refill    ALL medications    HISTORY OF PRESENT ILLNESS: This is a 53 y.o. female with history of hypertension here for follow-up and medication refills.  Has no complaints or medical concerns today.  HPI   Prior to Admission medications   Medication Sig Start Date End Date Taking? Authorizing Provider  metoprolol (TOPROL XL) 200 MG 24 hr tablet Take 1 tablet (200 mg total) by mouth daily. 05/07/17  Yes Shade Flood, MD  amLODipine (NORVASC) 5 MG tablet Take 1 tablet (5 mg total) by mouth daily. 10/28/18   Georgina Quint, MD  famotidine (PEPCID) 20 MG tablet Take 1 tablet (20 mg total) by mouth 2 (two) times daily. Patient not taking: Reported on 10/28/2018 05/07/17   Shade Flood, MD  hydrOXYzine (ATARAX/VISTARIL) 25 MG tablet Take 0.5-1 tablets (12.5-25 mg total) by mouth at bedtime as needed for itching. 10/28/18   Georgina Quint, MD  levothyroxine (SYNTHROID) 50 MCG tablet Take 1 tablet (50 mcg total) by mouth daily. 10/28/18   Georgina Quint, MD  lisinopril-hydrochlorothiazide (ZESTORETIC) 20-12.5 MG tablet Take 2 tablets by mouth daily. 10/28/18 01/26/19  Georgina Quint, MD  metoprolol (TOPROL-XL) 200 MG 24 hr tablet Take 1 tablet (200 mg total) by mouth daily. 10/28/18 01/26/19  Georgina Quint, MD  triamcinolone cream (KENALOG) 0.1 % Apply 1 application topically 2 (two) times daily. 10/28/18   Georgina Quint, MD  valACYclovir (VALTREX) 500 MG tablet Take 1 tablet (500 mg total) by mouth daily. 10/28/18   Georgina Quint, MD    Allergies  Allergen Reactions   Latex Itching and Rash    Patient Active Problem List   Diagnosis Date Noted   Hypertension 08/06/2012   Hypothyroidism 08/06/2012   Thyroid goiter 08/06/2012    Past Medical History:  Diagnosis Date   Allergy    Hypertension    Thyroid disease     Past Surgical History:  Procedure  Laterality Date   breast reduction      Social History   Socioeconomic History   Marital status: Married    Spouse name: Not on file   Number of children: Not on file   Years of education: Not on file   Highest education level: Not on file  Occupational History   Not on file  Social Needs   Financial resource strain: Not on file   Food insecurity:    Worry: Not on file    Inability: Not on file   Transportation needs:    Medical: Not on file    Non-medical: Not on file  Tobacco Use   Smoking status: Never Smoker   Smokeless tobacco: Never Used  Substance and Sexual Activity   Alcohol use: No   Drug use: No   Sexual activity: Yes  Lifestyle   Physical activity:    Days per week: Not on file    Minutes per session: Not on file   Stress: Not on file  Relationships   Social connections:    Talks on phone: Not on file    Gets together: Not on file    Attends religious service: Not on file    Active member of club or organization: Not on file    Attends meetings of clubs or organizations: Not on file    Relationship status: Not on file   Intimate partner violence:  Fear of current or ex partner: Not on file    Emotionally abused: Not on file    Physically abused: Not on file    Forced sexual activity: Not on file  Other Topics Concern   Not on file  Social History Narrative   Married   Education: Automotive engineer   Exercise: Walking    Family History  Problem Relation Age of Onset   Diabetes Mother    Stroke Father    Diabetes Sister      Review of Systems  Constitutional: Negative.  Negative for chills and fever.  HENT: Negative.  Negative for congestion, nosebleeds, sinus pain and sore throat.   Eyes: Negative.  Negative for blurred vision.  Respiratory: Negative.  Negative for cough, hemoptysis and shortness of breath.   Cardiovascular: Negative.  Negative for chest pain, palpitations, orthopnea, claudication, leg swelling and PND.    Gastrointestinal: Negative.  Negative for blood in stool, diarrhea, melena, nausea and vomiting.  Genitourinary: Negative.  Negative for dysuria and hematuria.  Musculoskeletal: Negative.  Negative for back pain, myalgias and neck pain.  Skin: Negative.   Neurological: Negative.  Negative for dizziness and headaches.  Endo/Heme/Allergies: Negative.   All other systems reviewed and are negative.   Vitals:   10/28/18 1515  BP: 140/88  Pulse: 63  Resp: 18  Temp: 98.4 F (36.9 C)  SpO2: 98%    Physical Exam Vitals signs reviewed.  Constitutional:      Appearance: Normal appearance.  HENT:     Head: Normocephalic and atraumatic.     Mouth/Throat:     Mouth: Mucous membranes are moist.     Pharynx: Oropharynx is clear.  Eyes:     Extraocular Movements: Extraocular movements intact.     Conjunctiva/sclera: Conjunctivae normal.     Pupils: Pupils are equal, round, and reactive to light.  Cardiovascular:     Rate and Rhythm: Normal rate and regular rhythm.     Pulses: Normal pulses.     Heart sounds: Normal heart sounds.  Pulmonary:     Effort: Pulmonary effort is normal.     Breath sounds: Normal breath sounds.  Abdominal:     Palpations: Abdomen is soft.     Tenderness: There is no abdominal tenderness.  Musculoskeletal: Normal range of motion.     Right lower leg: No edema.     Left lower leg: No edema.  Skin:    General: Skin is warm and dry.     Capillary Refill: Capillary refill takes less than 2 seconds.  Neurological:     General: No focal deficit present.     Mental Status: She is alert and oriented to person, place, and time.  Psychiatric:        Mood and Affect: Mood normal.        Behavior: Behavior normal.      ASSESSMENT & PLAN: Christopher was seen today for medication refill.  Diagnoses and all orders for this visit:  Essential hypertension -     amLODipine (NORVASC) 5 MG tablet; Take 1 tablet (5 mg total) by mouth daily. -      lisinopril-hydrochlorothiazide (ZESTORETIC) 20-12.5 MG tablet; Take 2 tablets by mouth daily. -     metoprolol (TOPROL-XL) 200 MG 24 hr tablet; Take 1 tablet (200 mg total) by mouth daily.  Insomnia, unspecified type -     hydrOXYzine (ATARAX/VISTARIL) 25 MG tablet; Take 0.5-1 tablets (12.5-25 mg total) by mouth at bedtime as needed for itching.  Hypothyroidism,  unspecified type -     levothyroxine (SYNTHROID) 50 MCG tablet; Take 1 tablet (50 mcg total) by mouth daily.  Herpes -     valACYclovir (VALTREX) 500 MG tablet; Take 1 tablet (500 mg total) by mouth daily.  Eczema -     triamcinolone cream (KENALOG) 0.1 %; Apply 1 application topically 2 (two) times daily.    Patient Instructions       If you have lab work done today you will be contacted with your lab results within the next 2 weeks.  If you have not heard from [place] then please contact us. The fastest way to get your results is to register for My Chart.   IF you received an x-ray today, you will receive an invoice from [facility]. Please contact [facility] at [phone] with questions or concerns regarding your invoice.   IF you received labwork today, you will receive an invoice from [place]. Please contact [facility] at [phone] with questions or concerns regarding your invoice.   Our billing staff will not be able to assist you with questions regarding bills from these companies.  You will be contacted with the lab results as soon as they are available. The fastest way to get your results is to activate your My Chart account. Instructions are located on the last page of this paperwork. If you have not heard from [place] regarding the results in 2 weeks, please contact this office.     Hypertension Hypertension, commonly called high blood pressure, is when the force of blood pumping through the arteries is too strong. The arteries are the blood vessels that carry blood from the heart throughout the  body. Hypertension forces the heart to work harder to pump blood and may cause arteries to become narrow or stiff. Having untreated or uncontrolled hypertension can cause heart attacks, strokes, kidney disease, and other problems. A blood pressure reading consists of a higher number over a lower number. Ideally, your blood pressure should be below 120/80. The first ("top") number is called the systolic pressure. It is a measure of the pressure in your arteries as your heart beats. The second ("bottom") number is called the diastolic pressure. It is a measure of the pressure in your arteries as the heart relaxes. What are the causes? The cause of this condition is not known. What increases the risk? Some risk factors for high blood pressure are under your control. Others are not. Factors you can change  Smoking.  Having type 2 diabetes mellitus, high cholesterol, or both.  Not getting enough exercise or physical activity.  Being overweight.  Having too much fat, sugar, calories, or salt (sodium) in your diet.  Drinking too much alcohol. Factors that are difficult or impossible to change  Having chronic kidney disease.  Having a family history of high blood pressure.  Age. Risk increases with age.  Race. You may be at higher risk if you are African-American.  Gender. Men are at higher risk than women before age 85. After age 31, women are at higher risk than men.  Having obstructive sleep apnea.  Stress. What are the signs or symptoms? Extremely high blood pressure (hypertensive crisis) may cause:  Headache.  Anxiety.  Shortness of breath.  Nosebleed.  Nausea and vomiting.  Severe chest pain.  Jerky movements you cannot control (seizures). How is this diagnosed? This condition is diagnosed by measuring your blood pressure while you are seated, with your arm resting on a surface. The cuff of the  blood pressure monitor will be placed directly against the skin of your  upper arm at the level of your heart. It should be measured at least twice using the same arm. Certain conditions can cause a difference in blood pressure between your right and left arms. Certain factors can cause blood pressure readings to be lower or higher than normal (elevated) for a short period of time:  When your blood pressure is higher when you are in a health care provider's office than when you are at home, this is called white coat hypertension. Most people with this condition do not need medicines.  When your blood pressure is higher at home than when you are in a health care provider's office, this is called masked hypertension. Most people with this condition may need medicines to control blood pressure. If you have a high blood pressure reading during one visit or you have normal blood pressure with other risk factors:  You may be asked to return on a different day to have your blood pressure checked again.  You may be asked to monitor your blood pressure at home for 1 week or longer. If you are diagnosed with hypertension, you may have other blood or imaging tests to help your health care provider understand your overall risk for other conditions. How is this treated? This condition is treated by making healthy lifestyle changes, such as eating healthy foods, exercising more, and reducing your alcohol intake. Your health care provider may prescribe medicine if lifestyle changes are not enough to get your blood pressure under control, and if:  Your systolic blood pressure is above 130.  Your diastolic blood pressure is above 80. Your personal target blood pressure may vary depending on your medical conditions, your age, and other factors. Follow these instructions at home: Eating and drinking   Eat a diet that is high in fiber and potassium, and low in sodium, added sugar, and fat. An example eating plan is called the DASH (Dietary Approaches to Stop Hypertension) diet. To eat  this way: ? Eat plenty of fresh fruits and vegetables. Try to fill half of your plate at each meal with fruits and vegetables. ? Eat whole grains, such as whole wheat pasta, brown rice, or whole grain bread. Fill about one quarter of your plate with whole grains. ? Eat or drink low-fat dairy products, such as skim milk or low-fat yogurt. ? Avoid fatty cuts of meat, processed or cured meats, and poultry with skin. Fill about one quarter of your plate with lean proteins, such as fish, chicken without skin, beans, eggs, and tofu. ? Avoid premade and processed foods. These tend to be higher in sodium, added sugar, and fat.  Reduce your daily sodium intake. Most people with hypertension should eat less than 1,500 mg of sodium a day.  Limit alcohol intake to no more than 1 drink a day for nonpregnant women and 2 drinks a day for men. One drink equals 12 oz of beer, 5 oz of wine, or 1 oz of hard liquor. Lifestyle   Work with your health care provider to maintain a healthy body weight or to lose weight. Ask what an ideal weight is for you.  Get at least 30 minutes of exercise that causes your heart to beat faster (aerobic exercise) most days of the week. Activities may include walking, swimming, or biking.  Include exercise to strengthen your muscles (resistance exercise), such as pilates or lifting weights, as part of your weekly exercise routine.  Try to do these types of exercises for 30 minutes at least 3 days a week.  Do not use any products that contain nicotine or tobacco, such as cigarettes and e-cigarettes. If you need help quitting, ask your health care provider.  Monitor your blood pressure at home as told by your health care provider.  Keep all follow-up visits as told by your health care provider. This is important. Medicines  Take over-the-counter and prescription medicines only as told by your health care provider. Follow directions carefully. Blood pressure medicines must be taken  as prescribed.  Do not skip doses of blood pressure medicine. Doing this puts you at risk for problems and can make the medicine less effective.  Ask your health care provider about side effects or reactions to medicines that you should watch for. Contact a health care provider if:  You think you are having a reaction to a medicine you are taking.  You have headaches that keep coming back (recurring).  You feel dizzy.  You have swelling in your ankles.  You have trouble with your vision. Get help right away if:  You develop a severe headache or confusion.  You have unusual weakness or numbness.  You feel faint.  You have severe pain in your chest or abdomen.  You vomit repeatedly.  You have trouble breathing. Summary  Hypertension is when the force of blood pumping through your arteries is too strong. If this condition is not controlled, it may put you at risk for serious complications.  Your personal target blood pressure may vary depending on your medical conditions, your age, and other factors. For most people, a normal blood pressure is less than 120/80.  Hypertension is treated with lifestyle changes, medicines, or a combination of both. Lifestyle changes include weight loss, eating a healthy, low-sodium diet, exercising more, and limiting alcohol. This information is not intended to replace advice given to you by your health care provider. Make sure you discuss any questions you have with your health care provider. Document Released: 05/14/2005 Document Revised: 04/11/2016 Document Reviewed: 04/11/2016 Elsevier Interactive Patient Education  2019 Elsevier Inc.      Edwina BarthMiguel Ferris Tally, MD [facility]

## 2018-10-28 NOTE — Patient Instructions (Addendum)
   If you have lab work done today you will be contacted with your lab results within the next 2 weeks.  If you have not heard from us then please contact us. The fastest way to get your results is to register for My Chart.   IF you received an x-ray today, you will receive an invoice from [facility]. Please contact [facility] at [phone] with questions or concerns regarding your invoice.   IF you received labwork today, you will receive an invoice from [facility]. Please contact [facility] at [phone] with questions or concerns regarding your invoice.   Our billing staff will not be able to assist you with questions regarding bills from these companies.  You will be contacted with the lab results as soon as they are available. The fastest way to get your results is to activate your My Chart account. Instructions are located on the last page of this paperwork. If you have not heard from us regarding the results in 2 weeks, please contact this office.       Hypertension Hypertension, commonly called high blood pressure, is when the force of blood pumping through the arteries is too strong. The arteries are the blood vessels that carry blood from the heart throughout the body. Hypertension forces the heart to work harder to pump blood and may cause arteries to become narrow or stiff. Having untreated or uncontrolled hypertension can cause heart attacks, strokes, kidney disease, and other problems. A blood pressure reading consists of a higher number over a lower number. Ideally, your blood pressure should be below 120/80. The first ("top") number is called the systolic pressure. It is a measure of the pressure in your arteries as your heart beats. The second ("bottom") number is called the diastolic pressure. It is a measure of the pressure in your arteries as the heart relaxes. What are the causes? The cause of this condition is not known. What increases the  risk? Some risk factors for high blood pressure are under your control. Others are not. Factors you can change  Smoking.  Having type 2 diabetes mellitus, high cholesterol, or both.  Not getting enough exercise or physical activity.  Being overweight.  Having too much fat, sugar, calories, or salt (sodium) in your diet.  Drinking too much alcohol. Factors that are difficult or impossible to change  Having chronic kidney disease.  Having a family history of high blood pressure.  Age. Risk increases with age.  Race. You may be at higher risk if you are African-American.  Gender. Men are at higher risk than women before age 45. After age 65, women are at higher risk than men.  Having obstructive sleep apnea.  Stress. What are the signs or symptoms? Extremely high blood pressure (hypertensive crisis) may cause:  Headache.  Anxiety.  Shortness of breath.  Nosebleed.  Nausea and vomiting.  Severe chest pain.  Jerky movements you cannot control (seizures). How is this diagnosed? This condition is diagnosed by measuring your blood pressure while you are seated, with your arm resting on a surface. The cuff of the blood pressure monitor will be placed directly against the skin of your upper arm at the level of your heart. It should be measured at least twice using the same arm. Certain conditions can cause a difference in blood pressure between your right and left arms. Certain factors can cause blood pressure readings to be lower or higher than normal (elevated) for a short period of time:    When your blood pressure is higher when you are in a health care provider's office than when you are at home, this is called white coat hypertension. Most people with this condition do not need medicines.  When your blood pressure is higher at home than when you are in a health care provider's office, this is called masked hypertension. Most people with this condition may need medicines  to control blood pressure. If you have a high blood pressure reading during one visit or you have normal blood pressure with other risk factors:  You may be asked to return on a different day to have your blood pressure checked again.  You may be asked to monitor your blood pressure at home for 1 week or longer. If you are diagnosed with hypertension, you may have other blood or imaging tests to help your health care provider understand your overall risk for other conditions. How is this treated? This condition is treated by making healthy lifestyle changes, such as eating healthy foods, exercising more, and reducing your alcohol intake. Your health care provider may prescribe medicine if lifestyle changes are not enough to get your blood pressure under control, and if:  Your systolic blood pressure is above 130.  Your diastolic blood pressure is above 80. Your personal target blood pressure may vary depending on your medical conditions, your age, and other factors. Follow these instructions at home: Eating and drinking   Eat a diet that is high in fiber and potassium, and low in sodium, added sugar, and fat. An example eating plan is called the DASH (Dietary Approaches to Stop Hypertension) diet. To eat this way: ? Eat plenty of fresh fruits and vegetables. Try to fill half of your plate at each meal with fruits and vegetables. ? Eat whole grains, such as whole wheat pasta, brown rice, or whole grain bread. Fill about one quarter of your plate with whole grains. ? Eat or drink low-fat dairy products, such as skim milk or low-fat yogurt. ? Avoid fatty cuts of meat, processed or cured meats, and poultry with skin. Fill about one quarter of your plate with lean proteins, such as fish, chicken without skin, beans, eggs, and tofu. ? Avoid premade and processed foods. These tend to be higher in sodium, added sugar, and fat.  Reduce your daily sodium intake. Most people with hypertension should  eat less than 1,500 mg of sodium a day.  Limit alcohol intake to no more than 1 drink a day for nonpregnant women and 2 drinks a day for men. One drink equals 12 oz of beer, 5 oz of wine, or 1 oz of hard liquor. Lifestyle   Work with your health care provider to maintain a healthy body weight or to lose weight. Ask what an ideal weight is for you.  Get at least 30 minutes of exercise that causes your heart to beat faster (aerobic exercise) most days of the week. Activities may include walking, swimming, or biking.  Include exercise to strengthen your muscles (resistance exercise), such as pilates or lifting weights, as part of your weekly exercise routine. Try to do these types of exercises for 30 minutes at least 3 days a week.  Do not use any products that contain nicotine or tobacco, such as cigarettes and e-cigarettes. If you need help quitting, ask your health care provider.  Monitor your blood pressure at home as told by your health care provider.  Keep all follow-up visits as told by your health care provider.   This is important. Medicines  Take over-the-counter and prescription medicines only as told by your health care provider. Follow directions carefully. Blood pressure medicines must be taken as prescribed.  Do not skip doses of blood pressure medicine. Doing this puts you at risk for problems and can make the medicine less effective.  Ask your health care provider about side effects or reactions to medicines that you should watch for. Contact a health care provider if:  You think you are having a reaction to a medicine you are taking.  You have headaches that keep coming back (recurring).  You feel dizzy.  You have swelling in your ankles.  You have trouble with your vision. Get help right away if:  You develop a severe headache or confusion.  You have unusual weakness or numbness.  You feel faint.  You have severe pain in your chest or abdomen.  You vomit  repeatedly.  You have trouble breathing. Summary  Hypertension is when the force of blood pumping through your arteries is too strong. If this condition is not controlled, it may put you at risk for serious complications.  Your personal target blood pressure may vary depending on your medical conditions, your age, and other factors. For most people, a normal blood pressure is less than 120/80.  Hypertension is treated with lifestyle changes, medicines, or a combination of both. Lifestyle changes include weight loss, eating a healthy, low-sodium diet, exercising more, and limiting alcohol. This information is not intended to replace advice given to you by your health care provider. Make sure you discuss any questions you have with your health care provider. Document Released: 05/14/2005 Document Revised: 04/11/2016 Document Reviewed: 04/11/2016 Elsevier Interactive Patient Education  2019 [employer]vier Inc.  

## 2018-10-29 NOTE — Addendum Note (Signed)
Addended by: Georg Ruddle A on: 10/29/2018 08:06 AM   Modules accepted: Orders

## 2019-02-06 DIAGNOSIS — Z1231 Encounter for screening mammogram for malignant neoplasm of breast: Secondary | ICD-10-CM | POA: Diagnosis not present

## 2019-03-28 ENCOUNTER — Encounter: Payer: Self-pay | Admitting: Podiatry

## 2019-03-28 ENCOUNTER — Other Ambulatory Visit: Payer: Self-pay | Admitting: Podiatry

## 2019-03-28 ENCOUNTER — Other Ambulatory Visit: Payer: Self-pay

## 2019-03-28 ENCOUNTER — Ambulatory Visit: Payer: Federal, State, Local not specified - PPO | Admitting: Podiatry

## 2019-03-28 ENCOUNTER — Other Ambulatory Visit: Payer: Self-pay | Admitting: *Deleted

## 2019-03-28 ENCOUNTER — Ambulatory Visit (INDEPENDENT_AMBULATORY_CARE_PROVIDER_SITE_OTHER): Payer: Federal, State, Local not specified - PPO

## 2019-03-28 DIAGNOSIS — M79672 Pain in left foot: Secondary | ICD-10-CM

## 2019-03-28 DIAGNOSIS — M109 Gout, unspecified: Secondary | ICD-10-CM

## 2019-03-28 NOTE — Progress Notes (Signed)
Subjective:  Patient ID: Holly Hays, female    DOB: [birth year],  MRN: [id number]  No chief complaint on file.   53 y.o. female presents with the above complaint.  States that she was in [place] for a wedding and while she was there her left great toe joint got very swollen and very painful to touch.  Could not even wear shoe.  Went to urgent care was given baclofen and Tylenol 3.  Says the pain did shortly subside and has not returned.  Never had this issue before.   Review of Systems: Negative except as noted in the HPI. Denies N/V/F/Ch.  Past Medical History:  Diagnosis Date  . Allergy   . Hypertension   . Thyroid disease     Current Outpatient Medications:  .  acetaminophen-codeine (TYLENOL #3) 300-30 MG tablet, Take 1 tablet by mouth 2 (two) times daily as needed., Disp: , Rfl:  .  amLODipine (NORVASC) 5 MG tablet, Take 1 tablet (5 mg total) by mouth daily., Disp: 90 tablet, Rfl: 1 .  amLODipine (NORVASC) 5 MG tablet, Take by mouth., Disp: , Rfl:  .  baclofen (LIORESAL) 10 MG tablet, SMARTSIG:1 Tablet(s) By Mouth Every 12 Hours PRN, Disp: , Rfl:  .  famotidine (PEPCID) 20 MG tablet, Take 1 tablet (20 mg total) by mouth 2 (two) times daily. (Patient not taking: Reported on 10/28/2018), Disp: 180 tablet, Rfl: 3 .  famotidine (PEPCID) 20 MG tablet, Take by mouth., Disp: , Rfl:  .  hydrOXYzine (ATARAX/VISTARIL) 25 MG tablet, Take 0.5-1 tablets (12.5-25 mg total) by mouth at bedtime as needed for itching., Disp: 30 tablet, Rfl: 0 .  levothyroxine (SYNTHROID) 50 MCG tablet, Take 1 tablet (50 mcg total) by mouth daily., Disp: 90 tablet, Rfl: 1 .  levothyroxine (SYNTHROID) 50 MCG tablet, Take by mouth., Disp: , Rfl:  .  lisinopril-hydrochlorothiazide (ZESTORETIC) 20-12.5 MG tablet, Take 2 tablets by mouth daily., Disp: 180 tablet, Rfl: 3 .  lisinopril-hydrochlorothiazide (ZESTORETIC) 20-12.5 MG tablet, TAKE 2 TABLETS BY MOUTH ONCE DAILY, Disp: , Rfl:  .  metoprolol (TOPROL XL) 200  MG 24 hr tablet, Take 1 tablet (200 mg total) by mouth daily., Disp: 90 tablet, Rfl: 1 .  metoprolol (TOPROL-XL) 200 MG 24 hr tablet, Take 1 tablet (200 mg total) by mouth daily., Disp: 90 tablet, Rfl: 3 .  metoprolol (TOPROL-XL) 200 MG 24 hr tablet, Take by mouth., Disp: , Rfl:  .  metroNIDAZOLE (FLAGYL) 500 MG tablet, , Disp: , Rfl:  .  triamcinolone cream (KENALOG) 0.1 %, Apply 1 application topically 2 (two) times daily., Disp: 45 g, Rfl: 3 .  triamcinolone cream (KENALOG) 0.1 %, Apply topically., Disp: , Rfl:  .  valACYclovir (VALTREX) 500 MG tablet, Take 1 tablet (500 mg total) by mouth daily., Disp: 90 tablet, Rfl: 0 .  valACYclovir (VALTREX) 500 MG tablet, TAKE 1 TABLET BY MOUTH ONCE DAILY, Disp: , Rfl:   Social History   Tobacco Use  Smoking Status Never Smoker  Smokeless Tobacco Never Used    Allergies  Allergen Reactions  . Latex Itching and Rash   Objective:  There were no vitals filed for this visit. There is no height or weight on file to calculate BMI. Constitutional Well developed. Well nourished.  Vascular Dorsalis pedis pulses palpable bilaterally. Posterior tibial pulses palpable bilaterally. Capillary refill normal to all digits.  No cyanosis or clubbing noted. Pedal hair growth normal.  Neurologic Normal speech. Oriented to person, place, and time. Epicritic sensation  to light touch grossly present bilaterally.  Dermatologic Nails well groomed and normal in appearance. No open wounds. No skin lesions.  Orthopedic: No POP left 1st MPJ no warmth erythema   Radiographs: Taken and reviewed with the fractures dislocations first metatarsal spur dorsally no joint degeneration Assessment:   1. Gout of left foot, unspecified cause, unspecified chronicity   2. Pain of left heel    Plan:  Patient was evaluated and treated and all questions answered.  Likely Gout Left 1st MPJ -X-rays reviewed with patient -No injection today, asymptomatic -Will check UA  -F/u in 1 month for review, sooner should issue reucr  Return in about 1 month (around 04/27/2019) for Gout F/u; review blood work .

## 2019-03-30 DIAGNOSIS — M109 Gout, unspecified: Secondary | ICD-10-CM | POA: Diagnosis not present

## 2019-03-31 LAB — URIC ACID: Uric Acid, Serum: 6.1 mg/dL (ref 2.5–7.0)

## 2019-04-19 ENCOUNTER — Other Ambulatory Visit: Payer: Self-pay | Admitting: Family Medicine

## 2019-04-19 DIAGNOSIS — B009 Herpesviral infection, unspecified: Secondary | ICD-10-CM

## 2019-04-28 ENCOUNTER — Ambulatory Visit: Payer: Federal, State, Local not specified - PPO | Admitting: Emergency Medicine

## 2019-04-28 ENCOUNTER — Other Ambulatory Visit: Payer: Self-pay

## 2019-04-28 ENCOUNTER — Encounter: Payer: Self-pay | Admitting: Emergency Medicine

## 2019-04-28 VITALS — BP 154/97 | HR 70 | Temp 98.0°F | Resp 16 | Ht 66.0 in | Wt 247.6 lb

## 2019-04-28 DIAGNOSIS — I10 Essential (primary) hypertension: Secondary | ICD-10-CM

## 2019-04-28 DIAGNOSIS — Z760 Encounter for issue of repeat prescription: Secondary | ICD-10-CM | POA: Diagnosis not present

## 2019-04-28 DIAGNOSIS — E039 Hypothyroidism, unspecified: Secondary | ICD-10-CM

## 2019-04-28 DIAGNOSIS — K219 Gastro-esophageal reflux disease without esophagitis: Secondary | ICD-10-CM

## 2019-04-28 DIAGNOSIS — B009 Herpesviral infection, unspecified: Secondary | ICD-10-CM

## 2019-04-28 DIAGNOSIS — G4700 Insomnia, unspecified: Secondary | ICD-10-CM

## 2019-04-28 DIAGNOSIS — L309 Dermatitis, unspecified: Secondary | ICD-10-CM

## 2019-04-28 MED ORDER — TRIAMCINOLONE ACETONIDE 0.1 % EX CREA: 1.0000 "application " | TOPICAL_CREAM | Freq: Two times a day (BID) | CUTANEOUS | 3 refills | Status: AC

## 2019-04-28 MED ORDER — BACLOFEN 10 MG PO TABS: ORAL_TABLET | ORAL | 0 refills | Status: AC

## 2019-04-28 MED ORDER — VALACYCLOVIR HCL 500 MG PO TABS: 500.0000 mg | ORAL_TABLET | Freq: Every day | ORAL | 1 refills | Status: AC

## 2019-04-28 MED ORDER — AMLODIPINE BESYLATE 5 MG PO TABS: 5.0000 mg | ORAL_TABLET | Freq: Every day | ORAL | 1 refills | Status: AC

## 2019-04-28 MED ORDER — FAMOTIDINE 20 MG PO TABS: 20.0000 mg | ORAL_TABLET | Freq: Two times a day (BID) | ORAL | 3 refills | Status: AC

## 2019-04-28 MED ORDER — LISINOPRIL-HYDROCHLOROTHIAZIDE 20-12.5 MG PO TABS: 2.0000 | ORAL_TABLET | Freq: Every day | ORAL | 1 refills | Status: AC

## 2019-04-28 MED ORDER — HYDROXYZINE HCL 25 MG PO TABS: 12.5000 mg | ORAL_TABLET | Freq: Every evening | ORAL | 0 refills | Status: AC | PRN

## 2019-04-28 MED ORDER — METOPROLOL SUCCINATE ER 200 MG PO TB24: 200.0000 mg | ORAL_TABLET | Freq: Every day | ORAL | 1 refills | Status: AC

## 2019-04-28 MED ORDER — LEVOTHYROXINE SODIUM 50 MCG PO TABS: 50.0000 ug | ORAL_TABLET | Freq: Every day | ORAL | 1 refills | Status: AC

## 2019-04-28 NOTE — Patient Instructions (Addendum)
   If you have lab work done today you will be contacted with your lab results within the next 2 weeks.  If you have not heard from us then please contact us. The fastest way to get your results is to register for My Chart.   IF you received an x-ray today, you will receive an invoice from [facility]. Please contact [facility] at [phone] with questions or concerns regarding your invoice.   IF you received labwork today, you will receive an invoice from [facility]. Please contact [facility] at [phone] with questions or concerns regarding your invoice.   Our billing staff will not be able to assist you with questions regarding bills from these companies.  You will be contacted with the lab results as soon as they are available. The fastest way to get your results is to activate your My Chart account. Instructions are located on the last page of this paperwork. If you have not heard from us regarding the results in 2 weeks, please contact this office.     Hypertension, Adult High blood pressure (hypertension) is when the force of blood pumping through the arteries is too strong. The arteries are the blood vessels that carry blood from the heart throughout the body. Hypertension forces the heart to work harder to pump blood and may cause arteries to become narrow or stiff. Untreated or uncontrolled hypertension can cause a heart attack, heart failure, a stroke, kidney disease, and other problems. A blood pressure reading consists of a higher number over a lower number. Ideally, your blood pressure should be below 120/80. The first ("top") number is called the systolic pressure. It is a measure of the pressure in your arteries as your heart beats. The second ("bottom") number is called the diastolic pressure. It is a measure of the pressure in your arteries as the heart relaxes. What are the causes? The exact cause of this condition is not known. There are some conditions  that result in or are related to high blood pressure. What increases the risk? Some risk factors for high blood pressure are under your control. The following factors may make you more likely to develop this condition:  Smoking.  Having type 2 diabetes mellitus, high cholesterol, or both.  Not getting enough exercise or physical activity.  Being overweight.  Having too much fat, sugar, calories, or salt (sodium) in your diet.  Drinking too much alcohol. Some risk factors for high blood pressure may be difficult or impossible to change. Some of these factors include:  Having chronic kidney disease.  Having a family history of high blood pressure.  Age. Risk increases with age.  Race. You may be at higher risk if you are African American.  Gender. Men are at higher risk than women before age 45. After age 65, women are at higher risk than men.  Having obstructive sleep apnea.  Stress. What are the signs or symptoms? High blood pressure may not cause symptoms. Very high blood pressure (hypertensive crisis) may cause:  Headache.  Anxiety.  Shortness of breath.  Nosebleed.  Nausea and vomiting.  Vision changes.  Severe chest pain.  Seizures. How is this diagnosed? This condition is diagnosed by measuring your blood pressure while you are seated, with your arm resting on a flat surface, your legs uncrossed, and your feet flat on the floor. The cuff of the blood pressure monitor will be placed directly against the skin of your upper arm at the level of your heart.   It should be measured at least twice using the same arm. Certain conditions can cause a difference in blood pressure between your right and left arms. Certain factors can cause blood pressure readings to be lower or higher than normal for a short period of time:  When your blood pressure is higher when you are in a health care provider's office than when you are at home, this is called white coat hypertension.  Most people with this condition do not need medicines.  When your blood pressure is higher at home than when you are in a health care provider's office, this is called masked hypertension. Most people with this condition may need medicines to control blood pressure. If you have a high blood pressure reading during one visit or you have normal blood pressure with other risk factors, you may be asked to:  Return on a different day to have your blood pressure checked again.  Monitor your blood pressure at home for 1 week or longer. If you are diagnosed with hypertension, you may have other blood or imaging tests to help your health care provider understand your overall risk for other conditions. How is this treated? This condition is treated by making healthy lifestyle changes, such as eating healthy foods, exercising more, and reducing your alcohol intake. Your health care provider may prescribe medicine if lifestyle changes are not enough to get your blood pressure under control, and if:  Your systolic blood pressure is above 130.  Your diastolic blood pressure is above 80. Your personal target blood pressure may vary depending on your medical conditions, your age, and other factors. Follow these instructions at home: Eating and drinking   Eat a diet that is high in fiber and potassium, and low in sodium, added sugar, and fat. An example eating plan is called the DASH (Dietary Approaches to Stop Hypertension) diet. To eat this way: ? Eat plenty of fresh fruits and vegetables. Try to fill one half of your plate at each meal with fruits and vegetables. ? Eat whole grains, such as whole-wheat pasta, brown rice, or whole-grain bread. Fill about one fourth of your plate with whole grains. ? Eat or drink low-fat dairy products, such as skim milk or low-fat yogurt. ? Avoid fatty cuts of meat, processed or cured meats, and poultry with skin. Fill about one fourth of your plate with lean proteins, such  as fish, chicken without skin, beans, eggs, or tofu. ? Avoid pre-made and processed foods. These tend to be higher in sodium, added sugar, and fat.  Reduce your daily sodium intake. Most people with hypertension should eat less than 1,500 mg of sodium a day.  Do not drink alcohol if: ? Your health care provider tells you not to drink. ? You are pregnant, may be pregnant, or are planning to become pregnant.  If you drink alcohol: ? Limit how much you use to:  0-1 drink a day for women.  0-2 drinks a day for men. ? Be aware of how much alcohol is in your drink. In the [place] one drink equals one 12 oz bottle of beer (355 mL), one 5 oz glass of wine (148 mL), or one 1 oz glass of hard liquor (44 mL). Lifestyle   Work with your health care provider to maintain a healthy body weight or to lose weight. Ask what an ideal weight is for you.  Get at least 30 minutes of exercise most days of the week. Activities may include walking, swimming, or   biking.  Include exercise to strengthen your muscles (resistance exercise), such as Pilates or lifting weights, as part of your weekly exercise routine. Try to do these types of exercises for 30 minutes at least 3 days a week.  Do not use any products that contain nicotine or tobacco, such as cigarettes, e-cigarettes, and chewing tobacco. If you need help quitting, ask your health care provider.  Monitor your blood pressure at home as told by your health care provider.  Keep all follow-up visits as told by your health care provider. This is important. Medicines  Take over-the-counter and prescription medicines only as told by your health care provider. Follow directions carefully. Blood pressure medicines must be taken as prescribed.  Do not skip doses of blood pressure medicine. Doing this puts you at risk for problems and can make the medicine less effective.  Ask your health care provider about side effects or reactions to medicines that you  should watch for. Contact a health care provider if you:  Think you are having a reaction to a medicine you are taking.  Have headaches that keep coming back (recurring).  Feel dizzy.  Have swelling in your ankles.  Have trouble with your vision. Get help right away if you:  Develop a severe headache or confusion.  Have unusual weakness or numbness.  Feel faint.  Have severe pain in your chest or abdomen.  Vomit repeatedly.  Have trouble breathing. Summary  Hypertension is when the force of blood pumping through your arteries is too strong. If this condition is not controlled, it may put you at risk for serious complications.  Your personal target blood pressure may vary depending on your medical conditions, your age, and other factors. For most people, a normal blood pressure is less than 120/80.  Hypertension is treated with lifestyle changes, medicines, or a combination of both. Lifestyle changes include losing weight, eating a healthy, low-sodium diet, exercising more, and limiting alcohol. This information is not intended to replace advice given to you by your health care provider. Make sure you discuss any questions you have with your health care provider. Document Released: 05/14/2005 Document Revised: 01/22/2018 Document Reviewed: 01/22/2018 [employer] Patient Education  2020 [employer]  

## 2019-04-28 NOTE — Progress Notes (Signed)
Hartford Poli 53 y.o.   Chief Complaint  Patient presents with  . Hypertension    6 month  . Medication Refill    PEND    HISTORY OF PRESENT ILLNESS: This is a 53 y.o. female with history of hypertension here for medication refill.  PCP is Dr. Neva Seat. BP Readings from Last 3 Encounters:  04/28/19 (!) 154/97  10/28/18 140/88  04/03/18 (!) 170/96  Has no complaints or medical concerns today.  HPI   Prior to Admission medications   Medication Sig Start Date End Date Taking? Authorizing Provider  amLODipine (NORVASC) 5 MG tablet Take 1 tablet (5 mg total) by mouth daily. 10/28/18  Yes Maansi Wike, Eilleen Kempf, MD  baclofen (LIORESAL) 10 MG tablet SMARTSIG:1 Tablet(s) By Mouth Every 12 Hours PRN 03/21/19  Yes [provider]  famotidine (PEPCID) 20 MG tablet Take 1 tablet (20 mg total) by mouth 2 (two) times daily. 05/07/17  Yes Shade Flood, MD  hydrOXYzine (ATARAX/VISTARIL) 25 MG tablet Take 0.5-1 tablets (12.5-25 mg total) by mouth at bedtime as needed for itching. 10/28/18  Yes Shawnmichael Parenteau, Eilleen Kempf, MD  levothyroxine (SYNTHROID) 50 MCG tablet Take 1 tablet (50 mcg total) by mouth daily. 10/28/18  Yes Kinneth Fujiwara, Eilleen Kempf, MD  triamcinolone cream (KENALOG) 0.1 % Apply 1 application topically 2 (two) times daily. 10/28/18  Yes Kodee Ravert, Eilleen Kempf, MD  valACYclovir (VALTREX) 500 MG tablet Take 1 tablet (500 mg total) by mouth daily. 10/28/18  Yes Jobeth Pangilinan, Eilleen Kempf, MD  acetaminophen-codeine (TYLENOL #3) 300-30 MG tablet Take 1 tablet by mouth 2 (two) times daily as needed. 03/21/19   [provider]  amLODipine (NORVASC) 5 MG tablet Take by mouth. 05/30/17   [provider]  famotidine (PEPCID) 20 MG tablet Take by mouth. 05/07/17   [provider]  levothyroxine (SYNTHROID) 50 MCG tablet Take by mouth. 05/07/17   [provider]  lisinopril-hydrochlorothiazide (ZESTORETIC) 20-12.5 MG tablet Take 2 tablets by mouth daily. 10/28/18 01/26/19   Georgina Quint, MD  lisinopril-hydrochlorothiazide (ZESTORETIC) 20-12.5 MG tablet TAKE 2 TABLETS BY MOUTH ONCE DAILY 01/06/18   [provider]  metoprolol (TOPROL XL) 200 MG 24 hr tablet Take 1 tablet (200 mg total) by mouth daily. 05/07/17   Shade Flood, MD  metoprolol (TOPROL-XL) 200 MG 24 hr tablet Take 1 tablet (200 mg total) by mouth daily. 10/28/18 01/26/19  Georgina Quint, MD  metoprolol (TOPROL-XL) 200 MG 24 hr tablet Take by mouth. 05/07/17   [provider]  metroNIDAZOLE (FLAGYL) 500 MG tablet  05/11/18   [provider]  triamcinolone cream (KENALOG) 0.1 % Apply topically. 05/07/17   [provider]  valACYclovir (VALTREX) 500 MG tablet TAKE 1 TABLET BY MOUTH ONCE DAILY 05/13/18   [provider]  valACYclovir (VALTREX) 500 MG tablet Take 1 tablet by mouth once daily 04/20/19   Shade Flood, MD    Allergies  Allergen Reactions  . Latex Itching and Rash    Patient Active Problem List   Diagnosis Date Noted  . Hypertension 08/06/2012  . Hypothyroidism 08/06/2012  . Thyroid goiter 08/06/2012  . History of infertility 09/13/2011    Past Medical History:  Diagnosis Date  . Allergy   . Hypertension   . Thyroid disease     Past Surgical History:  Procedure Laterality Date  . breast reduction      Social History   Socioeconomic History  . Marital status: Married    Spouse name: Not  on file  . Number of children: Not on file  . Years of education: Not on file  . Highest education level: Not on file  Occupational History  . Not on file  Social Needs  . Financial resource strain: Not on file  . Food insecurity    Worry: Not on file    Inability: Not on file  . Transportation needs    Medical: Not on file    Non-medical: Not on file  Tobacco Use  . Smoking status: Never Smoker  . Smokeless tobacco: Never Used  Substance and Sexual Activity  . Alcohol use: No  . Drug use: No  . Sexual  activity: Yes  Lifestyle  . Physical activity    Days per week: Not on file    Minutes per session: Not on file  . Stress: Not on file  Relationships  . Social Musician on phone: Not on file    Gets together: Not on file    Attends religious service: Not on file    Active member of club or organization: Not on file    Attends meetings of clubs or organizations: Not on file    Relationship status: Not on file  . Intimate partner violence    Fear of current or ex partner: Not on file    Emotionally abused: Not on file    Physically abused: Not on file    Forced sexual activity: Not on file  Other Topics Concern  . Not on file  Social History Narrative   Married   Education: Automotive engineer   Exercise: Walking    Family History  Problem Relation Age of Onset  . Diabetes Mother   . Stroke Father   . Diabetes Sister      Review of Systems  Constitutional: Negative.  Negative for chills and fever.  HENT: Negative.  Negative for congestion and sore throat.   Respiratory: Negative.  Negative for cough and shortness of breath.   Cardiovascular: Negative.  Negative for chest pain and palpitations.  Gastrointestinal: Negative.  Negative for abdominal pain, diarrhea, nausea and vomiting.  Skin: Negative.   Neurological: Negative for dizziness and headaches.  Psychiatric/Behavioral: The patient has insomnia.   All other systems reviewed and are negative.  Today's Vitals   04/28/19 1444  BP: (!) 154/97  Pulse: 70  Resp: 16  Temp: 98 F (36.7 C)  TempSrc: Oral  SpO2: 95%  Weight: 247 lb 9.6 oz (112.3 kg)  Height:  (1.676 m)   Body mass index is 39.96 kg/m.   Physical Exam Vitals signs reviewed.  Constitutional:      Appearance: Normal appearance.  HENT:     Head: Normocephalic.  Eyes:     Extraocular Movements: Extraocular movements intact.     Pupils: Pupils are equal, round, and reactive to light.  Neck:     Musculoskeletal: Normal range of motion.   Cardiovascular:     Rate and Rhythm: Normal rate and regular rhythm.     Heart sounds: Normal heart sounds.  Pulmonary:     Effort: Pulmonary effort is normal.     Breath sounds: Normal breath sounds.  Musculoskeletal: Normal range of motion.  Skin:    General: Skin is warm and dry.     Capillary Refill: Capillary refill takes less than 2 seconds.  Neurological:     General: No focal deficit present.     Mental Status: She is alert and oriented to person, place,  and time.  Psychiatric:        Mood and Affect: Mood normal.        Behavior: Behavior normal.      ASSESSMENT & PLAN: Lynsey was seen today for hypertension and medication refill.  Diagnoses and all orders for this visit:  Essential hypertension -     amLODipine (NORVASC) 5 MG tablet; Take 1 tablet (5 mg total) by mouth daily.  Insomnia, unspecified type -     hydrOXYzine (ATARAX/VISTARIL) 25 MG tablet; Take 0.5-1 tablets (12.5-25 mg total) by mouth at bedtime as needed for itching.  Encounter for medication refill  Gastroesophageal reflux disease -     famotidine (PEPCID) 20 MG tablet; Take 1 tablet (20 mg total) by mouth 2 (two) times daily.  Hypothyroidism, unspecified type -     levothyroxine (SYNTHROID) 50 MCG tablet; Take 1 tablet (50 mcg total) by mouth daily.  Hypertension, unspecified type -     metoprolol (TOPROL XL) 200 MG 24 hr tablet; Take 1 tablet (200 mg total) by mouth daily.  Eczema -     triamcinolone cream (KENALOG) 0.1 %; Apply 1 application topically 2 (two) times daily.  Herpes -     valACYclovir (VALTREX) 500 MG tablet; Take 1 tablet (500 mg total) by mouth daily.  Other orders -     baclofen (LIORESAL) 10 MG tablet; SMARTSIG:1 Tablet(s) By Mouth Every 12 Hours PRN -     lisinopril-hydrochlorothiazide (ZESTORETIC) 20-12.5 MG tablet; Take 2 tablets by mouth daily.    Patient Instructions       If you have lab work done today you will be contacted with your lab results within  the next 2 weeks.  If you have not heard from [place] then please contact us. The fastest way to get your results is to register for My Chart.   IF you received an x-ray today, you will receive an invoice from [facility]. Please contact [facility] at [phone] with questions or concerns regarding your invoice.   IF you received labwork today, you will receive an invoice from [facility]. Please contact [facility] at [phone] with questions or concerns regarding your invoice.   Our billing staff will not be able to assist you with questions regarding bills from these companies.  You will be contacted with the lab results as soon as they are available. The fastest way to get your results is to activate your My Chart account. Instructions are located on the last page of this paperwork. If you have not heard from [place] regarding the results in 2 weeks, please contact this office.     Hypertension, Adult High blood pressure (hypertension) is when the force of blood pumping through the arteries is too strong. The arteries are the blood vessels that carry blood from the heart throughout the body. Hypertension forces the heart to work harder to pump blood and may cause arteries to become narrow or stiff. Untreated or uncontrolled hypertension can cause a heart attack, heart failure, a stroke, kidney disease, and other problems. A blood pressure reading consists of a higher number over a lower number. Ideally, your blood pressure should be below 120/80. The first ("top") number is called the systolic pressure. It is a measure of the pressure in your arteries as your heart beats. The second ("bottom") number is called the diastolic pressure. It is a measure of the pressure in your arteries as the heart relaxes. What are the causes? The exact cause of this condition is not  known. There are some conditions that result in or are related to high blood pressure. What increases the risk? Some risk  factors for high blood pressure are under your control. The following factors may make you more likely to develop this condition:  Smoking.  Having type 2 diabetes mellitus, high cholesterol, or both.  Not getting enough exercise or physical activity.  Being overweight.  Having too much fat, sugar, calories, or salt (sodium) in your diet.  Drinking too much alcohol. Some risk factors for high blood pressure may be difficult or impossible to change. Some of these factors include:  Having chronic kidney disease.  Having a family history of high blood pressure.  Age. Risk increases with age.  Race. You may be at higher risk if you are African American.  Gender. Men are at higher risk than women before age 22. After age 95, women are at higher risk than men.  Having obstructive sleep apnea.  Stress. What are the signs or symptoms? High blood pressure may not cause symptoms. Very high blood pressure (hypertensive crisis) may cause:  Headache.  Anxiety.  Shortness of breath.  Nosebleed.  Nausea and vomiting.  Vision changes.  Severe chest pain.  Seizures. How is this diagnosed? This condition is diagnosed by measuring your blood pressure while you are seated, with your arm resting on a flat surface, your legs uncrossed, and your feet flat on the floor. The cuff of the blood pressure monitor will be placed directly against the skin of your upper arm at the level of your heart. It should be measured at least twice using the same arm. Certain conditions can cause a difference in blood pressure between your right and left arms. Certain factors can cause blood pressure readings to be lower or higher than normal for a short period of time:  When your blood pressure is higher when you are in a health care provider's office than when you are at home, this is called white coat hypertension. Most people with this condition do not need medicines.  When your blood pressure is higher  at home than when you are in a health care provider's office, this is called masked hypertension. Most people with this condition may need medicines to control blood pressure. If you have a high blood pressure reading during one visit or you have normal blood pressure with other risk factors, you may be asked to:  Return on a different day to have your blood pressure checked again.  Monitor your blood pressure at home for 1 week or longer. If you are diagnosed with hypertension, you may have other blood or imaging tests to help your health care provider understand your overall risk for other conditions. How is this treated? This condition is treated by making healthy lifestyle changes, such as eating healthy foods, exercising more, and reducing your alcohol intake. Your health care provider may prescribe medicine if lifestyle changes are not enough to get your blood pressure under control, and if:  Your systolic blood pressure is above 130.  Your diastolic blood pressure is above 80. Your personal target blood pressure may vary depending on your medical conditions, your age, and other factors. Follow these instructions at home: Eating and drinking   Eat a diet that is high in fiber and potassium, and low in sodium, added sugar, and fat. An example eating plan is called the DASH (Dietary Approaches to Stop Hypertension) diet. To eat this way: ? Eat plenty of fresh fruits and vegetables.  Try to fill one half of your plate at each meal with fruits and vegetables. ? Eat whole grains, such as whole-wheat pasta, brown rice, or whole-grain bread. Fill about one fourth of your plate with whole grains. ? Eat or drink low-fat dairy products, such as skim milk or low-fat yogurt. ? Avoid fatty cuts of meat, processed or cured meats, and poultry with skin. Fill about one fourth of your plate with lean proteins, such as fish, chicken without skin, beans, eggs, or tofu. ? Avoid pre-made and processed foods.  These tend to be higher in sodium, added sugar, and fat.  Reduce your daily sodium intake. Most people with hypertension should eat less than 1,500 mg of sodium a day.  Do not drink alcohol if: ? Your health care provider tells you not to drink. ? You are pregnant, may be pregnant, or are planning to become pregnant.  If you drink alcohol: ? Limit how much you use to:  0-1 drink a day for women.  0-2 drinks a day for men. ? Be aware of how much alcohol is in your drink. In the [place] one drink equals one 12 oz bottle of beer (355 mL), one 5 oz glass of wine (148 mL), or one 1 oz glass of hard liquor (44 mL). Lifestyle   Work with your health care provider to maintain a healthy body weight or to lose weight. Ask what an ideal weight is for you.  Get at least 30 minutes of exercise most days of the week. Activities may include walking, swimming, or biking.  Include exercise to strengthen your muscles (resistance exercise), such as Pilates or lifting weights, as part of your weekly exercise routine. Try to do these types of exercises for 30 minutes at least 3 days a week.  Do not use any products that contain nicotine or tobacco, such as cigarettes, e-cigarettes, and chewing tobacco. If you need help quitting, ask your health care provider.  Monitor your blood pressure at home as told by your health care provider.  Keep all follow-up visits as told by your health care provider. This is important. Medicines  Take over-the-counter and prescription medicines only as told by your health care provider. Follow directions carefully. Blood pressure medicines must be taken as prescribed.  Do not skip doses of blood pressure medicine. Doing this puts you at risk for problems and can make the medicine less effective.  Ask your health care provider about side effects or reactions to medicines that you should watch for. Contact a health care provider if you:  Think you are having a reaction to  a medicine you are taking.  Have headaches that keep coming back (recurring).  Feel dizzy.  Have swelling in your ankles.  Have trouble with your vision. Get help right away if you:  Develop a severe headache or confusion.  Have unusual weakness or numbness.  Feel faint.  Have severe pain in your chest or abdomen.  Vomit repeatedly.  Have trouble breathing. Summary  Hypertension is when the force of blood pumping through your arteries is too strong. If this condition is not controlled, it may put you at risk for serious complications.  Your personal target blood pressure may vary depending on your medical conditions, your age, and other factors. For most people, a normal blood pressure is less than 120/80.  Hypertension is treated with lifestyle changes, medicines, or a combination of both. Lifestyle changes include losing weight, eating a healthy, low-sodium diet, exercising more, and  limiting alcohol. This information is not intended to replace advice given to you by your health care provider. Make sure you discuss any questions you have with your health care provider. Document Released: 05/14/2005 Document Revised: 01/22/2018 Document Reviewed: 01/22/2018 Elsevier Patient Education  2020 [employer]      Edwina Barth, MD [facility]

## 2019-05-01 ENCOUNTER — Ambulatory Visit: Payer: Federal, State, Local not specified - PPO | Admitting: Podiatry

## 2019-07-06 ENCOUNTER — Encounter: Payer: Self-pay | Admitting: *Deleted

## 2019-07-06 ENCOUNTER — Encounter: Payer: Self-pay | Admitting: Emergency Medicine

## 2019-07-06 ENCOUNTER — Other Ambulatory Visit: Payer: Self-pay

## 2019-07-06 ENCOUNTER — Ambulatory Visit: Payer: Federal, State, Local not specified - PPO | Admitting: Emergency Medicine

## 2019-07-06 VITALS — BP 167/102 | HR 85 | Temp 98.7°F | Resp 16 | Ht 66.0 in | Wt 249.0 lb

## 2019-07-06 DIAGNOSIS — I10 Essential (primary) hypertension: Secondary | ICD-10-CM

## 2019-07-06 DIAGNOSIS — E039 Hypothyroidism, unspecified: Secondary | ICD-10-CM

## 2019-07-06 DIAGNOSIS — T7840XA Allergy, unspecified, initial encounter: Secondary | ICD-10-CM | POA: Diagnosis not present

## 2019-07-06 MED ORDER — PREDNISONE 20 MG PO TABS: 40.0000 mg | ORAL_TABLET | Freq: Every day | ORAL | 0 refills | Status: AC

## 2019-07-06 MED ORDER — METHYLPREDNISOLONE SODIUM SUCC 125 MG IJ SOLR
125.0000 mg | Freq: Once | INTRAMUSCULAR | Status: AC
  Administered 2019-07-06: 125 mg via INTRAMUSCULAR

## 2019-07-06 NOTE — Progress Notes (Signed)
Holly Hays 54 y.o.   Chief Complaint  Patient presents with  . Facial Swelling    per pt since Friday after dying her hair, she thinks she left in on too long    HISTORY OF PRESENT ILLNESS: This is a 54 y.o. female complaining of swelling around left eye and forehead/cheek areas that started 2 days ago after using hair dye.  Hairline also broke out in rash.  Believes this is an allergic reaction.  Denies difficulty breathing or trouble swallowing.  Denies tongue swelling or any other associated symptoms.  HPI   Prior to Admission medications   Medication Sig Start Date End Date Taking? Authorizing Provider  acetaminophen-codeine (TYLENOL #3) 300-30 MG tablet Take 1 tablet by mouth 2 (two) times daily as needed. 03/21/19  Yes [provider]  amLODipine (NORVASC) 5 MG tablet Take by mouth. 05/30/17  Yes [provider]  baclofen (LIORESAL) 10 MG tablet SMARTSIG:1 Tablet(s) By Mouth Every 12 Hours PRN 04/28/19  Yes Zainah Steven, Eilleen Kempf, MD  famotidine (PEPCID) 20 MG tablet Take by mouth. 05/07/17  Yes [provider]  hydrOXYzine (ATARAX/VISTARIL) 25 MG tablet Take 0.5-1 tablets (12.5-25 mg total) by mouth at bedtime as needed for itching. 04/28/19  Yes SagardiaEilleen Kempf, MD  levothyroxine (SYNTHROID) 50 MCG tablet Take by mouth. 05/07/17  Yes [provider]  lisinopril-hydrochlorothiazide (ZESTORETIC) 20-12.5 MG tablet Take 2 tablets by mouth daily. 04/28/19 07/27/19 Yes Aariyana Manz, Eilleen Kempf, MD  metoprolol (TOPROL XL) 200 MG 24 hr tablet Take 1 tablet (200 mg total) by mouth daily. 04/28/19  Yes Romon Devereux, Eilleen Kempf, MD  triamcinolone cream (KENALOG) 0.1 % Apply 1 application topically 2 (two) times daily. 04/28/19  Yes Shavy Beachem, Eilleen Kempf, MD  valACYclovir (VALTREX) 500 MG tablet TAKE 1 TABLET BY MOUTH ONCE DAILY 05/13/18  Yes [provider]  amLODipine (NORVASC) 5 MG tablet Take 1 tablet (5 mg total) by mouth daily. 04/28/19   Georgina Quint, MD  famotidine (PEPCID) 20 MG tablet Take 1 tablet (20 mg total) by mouth 2 (two) times daily. 04/28/19   Georgina Quint, MD  levothyroxine (SYNTHROID) 50 MCG tablet Take 1 tablet (50 mcg total) by mouth daily. 04/28/19   Georgina Quint, MD  lisinopril-hydrochlorothiazide (ZESTORETIC) 20-12.5 MG tablet Take 2 tablets by mouth daily. 10/28/18 01/26/19  Georgina Quint, MD  metoprolol (TOPROL-XL) 200 MG 24 hr tablet Take 1 tablet (200 mg total) by mouth daily. 10/28/18 01/26/19  Georgina Quint, MD  metoprolol (TOPROL-XL) 200 MG 24 hr tablet Take by mouth. 05/07/17   [provider]  metroNIDAZOLE (FLAGYL) 500 MG tablet  05/11/18   [provider]  triamcinolone cream (KENALOG) 0.1 % Apply topically. 05/07/17   [provider]  valACYclovir (VALTREX) 500 MG tablet Take 1 tablet by mouth once daily Patient not taking: Reported on 07/06/2019 04/20/19   Shade Flood, MD  valACYclovir (VALTREX) 500 MG tablet Take 1 tablet (500 mg total) by mouth daily. Patient not taking: Reported on 07/06/2019 04/28/19   Georgina Quint, MD    Allergies  Allergen Reactions  . Latex Itching and Rash    Patient Active Problem List   Diagnosis Date Noted  . Hypertension 08/06/2012  . Hypothyroidism 08/06/2012  . Thyroid goiter 08/06/2012  . History of infertility 09/13/2011    Past Medical History:  Diagnosis Date  . Allergy   . Hypertension   . Thyroid disease     Past Surgical History:  Procedure Laterality Date  . breast reduction      Social History   Socioeconomic History  . Marital status: Married    Spouse name: Not on file  . Number of children: Not on file  . Years of education: Not on file  . Highest education level: Not on file  Occupational History  . Not on file  Tobacco Use  . Smoking status: Never Smoker  . Smokeless tobacco: Never Used  Substance and Sexual Activity  . Alcohol use: No  . Drug use: No  .  Sexual activity: Yes  Other Topics Concern  . Not on file  Social History Narrative   Married   Education: Secretary/administrator   Exercise: Walking   Social Determinants of Health   Financial Resource Strain:   . Difficulty of Paying Living Expenses: Not on file  Food Insecurity:   . Worried About Charity fundraiser in the Last Year: Not on file  . Ran Out of Food in the Last Year: Not on file  Transportation Needs:   . Lack of Transportation (Medical): Not on file  . Lack of Transportation (Non-Medical): Not on file  Physical Activity:   . Days of Exercise per Week: Not on file  . Minutes of Exercise per Session: Not on file  Stress:   . Feeling of Stress : Not on file  Social Connections:   . Frequency of Communication with Friends and Family: Not on file  . Frequency of Social Gatherings with Friends and Family: Not on file  . Attends Religious Services: Not on file  . Active Member of Clubs or Organizations: Not on file  . Attends Archivist Meetings: Not on file  . Marital Status: Not on file  Intimate Partner Violence:   . Fear of Current or Ex-Partner: Not on file  . Emotionally Abused: Not on file  . Physically Abused: Not on file  . Sexually Abused: Not on file    Family History  Problem Relation Age of Onset  . Diabetes Mother   . Stroke Father   . Diabetes Sister      Review of Systems  Constitutional: Negative.  Negative for chills and fever.  HENT: Negative.  Negative for congestion and sore throat.   Eyes: Negative.  Negative for blurred vision and double vision.  Respiratory: Negative.  Negative for cough and shortness of breath.   Cardiovascular: Negative.  Negative for chest pain and palpitations.  Gastrointestinal: Negative.  Negative for abdominal pain, diarrhea, nausea and vomiting.  Genitourinary: Negative.  Negative for dysuria and hematuria.  Musculoskeletal: Negative.  Negative for back pain, myalgias and neck pain.  Skin: Positive for rash  (scalp).  Neurological: Negative.  Negative for dizziness and headaches.  Endo/Heme/Allergies: Negative.     Vitals:   07/06/19 0826  BP: (!) 167/102  Pulse: 85  Resp: 16  Temp: 98.7 F (37.1 C)  SpO2: 96%   Has not taken blood pressure medication today yet. Physical Exam Vitals reviewed.  Constitutional:      Appearance: Normal appearance.  HENT:     Head: Normocephalic.     Mouth/Throat:     Mouth: Mucous membranes are moist.     Pharynx: Oropharynx is clear. No posterior oropharyngeal erythema.  Eyes:     Extraocular Movements: Extraocular movements intact.     Conjunctiva/sclera: Conjunctivae normal.     Pupils: Pupils are equal, round, and reactive to light.     Comments: Left periorbital swelling.  No erythema or signs of infection.  Cardiovascular:     Rate and Rhythm: Normal rate and regular rhythm.  Pulmonary:     Effort: Pulmonary effort is normal.     Breath sounds: Normal breath sounds.  Musculoskeletal:        General: Normal range of motion.     Cervical back: Normal range of motion and neck supple.  Skin:    General: Skin is warm and dry.     Findings: No rash.     Comments: No hives  Neurological:     General: No focal deficit present.     Mental Status: She is alert and oriented to person, place, and time.     Cranial Nerves: No cranial nerve deficit.     Sensory: No sensory deficit.     Motor: No weakness.     Coordination: Coordination normal.     Gait: Gait normal.     Comments: No facial paralysis  Psychiatric:        Mood and Affect: Mood normal.        Behavior: Behavior normal.    A total of 30 minutes was spent with the patient, greater than 50% of which was in counseling/coordination of care regarding allergic reactions and treatment including medications and triggers avoidance, ED precautions, review of most recent office visit notes, review of medications and chronic medical conditions, hypertension and need to take medications daily,  prognosis and need for follow-up if no better or worse in the next several days.   ASSESSMENT & PLAN: Daizy was seen today for facial swelling.  Diagnoses and all orders for this visit:  Acute allergic reaction, initial encounter -     methylPREDNISolone sodium succinate (SOLU-MEDROL) 125 mg/2 mL injection 125 mg -     predniSONE (DELTASONE) 20 MG tablet; Take 2 tablets (40 mg total) by mouth daily with breakfast for 5 days.  Essential hypertension  Hypothyroidism, unspecified type    Patient Instructions       If you have lab work done today you will be contacted with your lab results within the next 2 weeks.  If you have not heard from [place] then please contact us. The fastest way to get your results is to register for My Chart.   IF you received an x-ray today, you will receive an invoice from [facility]. Please contact [facility] at [phone] with questions or concerns regarding your invoice.   IF you received labwork today, you will receive an invoice from [place]. Please contact [facility] at [phone] with questions or concerns regarding your invoice.   Our billing staff will not be able to assist you with questions regarding bills from these companies.  You will be contacted with the lab results as soon as they are available. The fastest way to get your results is to activate your My Chart account. Instructions are located on the last page of this paperwork. If you have not heard from [place] regarding the results in 2 weeks, please contact this office.     Allergies, Adult An allergy means that your body reacts to something that bothers it (allergen). It is not a normal reaction. This can happen from something that you:  Eat.  Breathe in.  Touch. You can have an allergy (be allergic) to:  Outdoor things, like: ? Pollen. ? Grass. ? Weeds.  Indoor things, like: ? Dust. ? Smoke. ? Pet dander.  Foods.  Medicines.  Things that bother  your skin, like: ?  Detergents. ? Chemicals. ? Latex.  Perfume.  Bugs. An allergy cannot spread from person to person (is not contagious). Follow these instructions at home:         Stay away from things that you know you are allergic to.  If you have allergies to things in the air, wash out your nose each day. Do it with one of these: ? A salt-water (saline) spray. ? A container (neti pot).  Take over-the-counter and prescription medicines only as told by your doctor.  Keep all follow-up visits as told by your doctor. This is important.  If you are at risk for a very bad allergy reaction (anaphylaxis), keep an auto-injector with you all the time. This is called an epinephrine injection. ? This is pre-measured medicine with a needle. You can put it into your skin by yourself. ? Right after you have a very bad allergy reaction, you or a person with you must give the medicine in less than a few minutes. This is an emergency.  If you have ever had a very bad allergy reaction, wear a medical alert bracelet or necklace. Your very bad allergy should be written on it. Contact a health care provider if:  Your symptoms do not get better with treatment. Get help right away if:  You have symptoms of a very bad allergy reaction. These include: ? A swollen mouth, tongue, or throat. ? Pain or tightness in your chest. ? Trouble breathing. ? Being short of breath. ? Dizziness. ? Fainting. ? Very bad pain in your belly (abdomen). ? Throwing up (vomiting). ? Watery poop (diarrhea). Summary  An allergy means that your body reacts to something that bothers it (allergen). It is not a normal reaction.  Stay away from things that make your body react.  Take over-the-counter and prescription medicines only as told by your doctor.  If you are at risk for a very bad allergy reaction, carry an auto-injector (epinephrine injection) all the time. Also, wear a medical alert bracelet or  necklace so people know about your allergy. This information is not intended to replace advice given to you by your health care provider. Make sure you discuss any questions you have with your health care provider. Document Revised: 09/02/2018 Document Reviewed: 08/27/2016 Elsevier Patient Education  2020 Elsevier Inc.      Edwina Barth, MD [facility]

## 2019-07-06 NOTE — Patient Instructions (Addendum)
If you have lab work done today you will be contacted with your lab results within the next 2 weeks.  If you have not heard from [place] then please contact us. The fastest way to get your results is to register for My Chart.   IF you received an x-ray today, you will receive an invoice from [facility]. Please contact [facility] at [phone] with questions or concerns regarding your invoice.   IF you received labwork today, you will receive an invoice from [place]. Please contact [facility] at [phone] with questions or concerns regarding your invoice.   Our billing staff will not be able to assist you with questions regarding bills from these companies.  You will be contacted with the lab results as soon as they are available. The fastest way to get your results is to activate your My Chart account. Instructions are located on the last page of this paperwork. If you have not heard from [place] regarding the results in 2 weeks, please contact this office.     Allergies, Adult An allergy means that your body reacts to something that bothers it (allergen). It is not a normal reaction. This can happen from something that you:  Eat.  Breathe in.  Touch. You can have an allergy (be allergic) to:  Outdoor things, like: ? Pollen. ? Grass. ? Weeds.  Indoor things, like: ? Dust. ? Smoke. ? Pet dander.  Foods.  Medicines.  Things that bother your skin, like: ? Detergents. ? Chemicals. ? Latex.  Perfume.  Bugs. An allergy cannot spread from person to person (is not contagious). Follow these instructions at home:         Stay away from things that you know you are allergic to.  If you have allergies to things in the air, wash out your nose each day. Do it with one of these: ? A salt-water (saline) spray. ? A container (neti pot).  Take over-the-counter and prescription medicines only as told by your doctor.  Keep all follow-up visits as told by  your doctor. This is important.  If you are at risk for a very bad allergy reaction (anaphylaxis), keep an auto-injector with you all the time. This is called an epinephrine injection. ? This is pre-measured medicine with a needle. You can put it into your skin by yourself. ? Right after you have a very bad allergy reaction, you or a person with you must give the medicine in less than a few minutes. This is an emergency.  If you have ever had a very bad allergy reaction, wear a medical alert bracelet or necklace. Your very bad allergy should be written on it. Contact a health care provider if:  Your symptoms do not get better with treatment. Get help right away if:  You have symptoms of a very bad allergy reaction. These include: ? A swollen mouth, tongue, or throat. ? Pain or tightness in your chest. ? Trouble breathing. ? Being short of breath. ? Dizziness. ? Fainting. ? Very bad pain in your belly (abdomen). ? Throwing up (vomiting). ? Watery poop (diarrhea). Summary  An allergy means that your body reacts to something that bothers it (allergen). It is not a normal reaction.  Stay away from things that make your body react.  Take over-the-counter and prescription medicines only as told by your doctor.  If you are at risk for a very bad allergy reaction, carry an auto-injector (epinephrine injection) all the time. Also, wear  a medical alert bracelet or necklace so people know about your allergy. This information is not intended to replace advice given to you by your health care provider. Make sure you discuss any questions you have with your health care provider. Document Revised: 09/02/2018 Document Reviewed: 08/27/2016 Elsevier Patient Education  [place].

## 2019-09-14 DIAGNOSIS — E039 Hypothyroidism, unspecified: Secondary | ICD-10-CM | POA: Diagnosis not present

## 2019-09-14 DIAGNOSIS — I10 Essential (primary) hypertension: Secondary | ICD-10-CM | POA: Diagnosis not present

## 2019-09-14 DIAGNOSIS — E559 Vitamin D deficiency, unspecified: Secondary | ICD-10-CM | POA: Diagnosis not present

## 2019-09-14 DIAGNOSIS — D7589 Other specified diseases of blood and blood-forming organs: Secondary | ICD-10-CM | POA: Diagnosis not present

## 2019-10-09 DIAGNOSIS — R7301 Impaired fasting glucose: Secondary | ICD-10-CM | POA: Diagnosis not present

## 2019-10-28 ENCOUNTER — Ambulatory Visit: Payer: Federal, State, Local not specified - PPO | Admitting: Family Medicine

## 2020-02-12 DIAGNOSIS — Z1231 Encounter for screening mammogram for malignant neoplasm of breast: Secondary | ICD-10-CM | POA: Diagnosis not present

## 2020-02-23 ENCOUNTER — Telehealth: Payer: Self-pay | Admitting: Family Medicine

## 2020-02-23 NOTE — Telephone Encounter (Signed)
LVM to let pt know that her immunization record has been placed up front at the clerical desk waiting for pick up.

## 2020-02-23 NOTE — Telephone Encounter (Signed)
Pt called and is needing [place] immunization records printed out because pt is going some type of study and they are needing this. Pt would like a call when this is ready for pick up. Please advise.

## 2020-03-11 DIAGNOSIS — Z23 Encounter for immunization: Secondary | ICD-10-CM | POA: Diagnosis not present

## 2020-03-11 DIAGNOSIS — K219 Gastro-esophageal reflux disease without esophagitis: Secondary | ICD-10-CM | POA: Diagnosis not present

## 2020-03-11 DIAGNOSIS — R7301 Impaired fasting glucose: Secondary | ICD-10-CM | POA: Diagnosis not present

## 2020-03-11 DIAGNOSIS — Z0001 Encounter for general adult medical examination with abnormal findings: Secondary | ICD-10-CM | POA: Diagnosis not present

## 2020-03-11 DIAGNOSIS — I10 Essential (primary) hypertension: Secondary | ICD-10-CM | POA: Diagnosis not present

## 2020-03-11 DIAGNOSIS — E785 Hyperlipidemia, unspecified: Secondary | ICD-10-CM | POA: Diagnosis not present

## 2020-03-11 DIAGNOSIS — E559 Vitamin D deficiency, unspecified: Secondary | ICD-10-CM | POA: Diagnosis not present

## 2020-03-14 DIAGNOSIS — Z0001 Encounter for general adult medical examination with abnormal findings: Secondary | ICD-10-CM | POA: Diagnosis not present

## 2020-03-14 DIAGNOSIS — F329 Major depressive disorder, single episode, unspecified: Secondary | ICD-10-CM | POA: Diagnosis not present

## 2020-03-15 ENCOUNTER — Other Ambulatory Visit: Payer: Self-pay | Admitting: Family Medicine

## 2020-03-15 DIAGNOSIS — E049 Nontoxic goiter, unspecified: Secondary | ICD-10-CM

## 2020-03-22 ENCOUNTER — Ambulatory Visit
Admission: RE | Admit: 2020-03-22 | Discharge: 2020-03-22 | Disposition: A | Discharge status: Home / Self Care | Payer: Federal, State, Local not specified - PPO | Source: Ambulatory Visit | Attending: Family Medicine | Admitting: Family Medicine

## 2020-03-22 DIAGNOSIS — E049 Nontoxic goiter, unspecified: Secondary | ICD-10-CM

## 2020-03-22 DIAGNOSIS — E010 Iodine-deficiency related diffuse (endemic) goiter: Secondary | ICD-10-CM | POA: Diagnosis not present

## 2020-03-22 DIAGNOSIS — E042 Nontoxic multinodular goiter: Secondary | ICD-10-CM | POA: Diagnosis not present

## 2020-03-28 DIAGNOSIS — I10 Essential (primary) hypertension: Secondary | ICD-10-CM | POA: Diagnosis not present

## 2020-04-11 DIAGNOSIS — E049 Nontoxic goiter, unspecified: Secondary | ICD-10-CM | POA: Diagnosis not present

## 2020-11-25 NOTE — Addendum Note (Signed)
Addended by: Georg Ruddle A on: 11/25/2020 01:51 PM   Modules accepted: Orders

## 2021-08-21 ENCOUNTER — Other Ambulatory Visit: Payer: Self-pay | Admitting: Family Medicine

## 2021-08-21 DIAGNOSIS — E042 Nontoxic multinodular goiter: Secondary | ICD-10-CM

## 2021-08-21 DIAGNOSIS — E010 Iodine-deficiency related diffuse (endemic) goiter: Secondary | ICD-10-CM

## 2021-11-15 ENCOUNTER — Ambulatory Visit: Payer: 59 | Admitting: Family Medicine

## 2021-11-15 ENCOUNTER — Encounter: Payer: Self-pay | Admitting: Family Medicine

## 2021-11-15 VITALS — BP 168/97 | HR 101 | Temp 97.5°F | Ht 66.0 in | Wt 230.0 lb

## 2021-11-15 DIAGNOSIS — J040 Acute laryngitis: Secondary | ICD-10-CM | POA: Diagnosis not present

## 2021-11-15 DIAGNOSIS — I10 Essential (primary) hypertension: Secondary | ICD-10-CM

## 2021-11-15 MED ORDER — BENZONATATE 100 MG PO CAPS: 100.0000 mg | ORAL_CAPSULE | Freq: Two times a day (BID) | ORAL | 0 refills | Status: AC | PRN

## 2021-11-15 MED ORDER — PREDNISONE 20 MG PO TABS: 60.0000 mg | ORAL_TABLET | Freq: Every day | ORAL | 0 refills | Status: AC

## 2021-11-15 MED ORDER — DM-GUAIFENESIN ER 30-600 MG PO TB12: 1.0000 | ORAL_TABLET | Freq: Two times a day (BID) | ORAL | 0 refills | Status: AC

## 2021-11-15 NOTE — Progress Notes (Signed)
Acute Office Visit  Subjective:     Patient ID: Holly Hays, female    DOB: [birth year], 55 y.o.   MRN: [id number]  Chief Complaint  Patient presents with   Sore Throat    C/O sore throat X2 days.    56yo F who presents for evaluation of sore throat. Associated symptoms include nasal blockage, sinus and nasal congestion, and sore throat. Onset of symptoms was 2 day ago, and have been gradually worsening since that time. She is drinking plenty of fluids. She has had a recent close exposure to someone with proven npharyngitis.  Patient has history of hypertension.  She is on multiple medications.  States her blood pressure is typically uncontrolled with systolic in the blood pressure being above 160.  Patient states she has not had a work-up for her hypertension in the past.  States she feels somewhat unwell from her hypertension.  Denies chest pain.  Does not deny or affirms shortness of breath and leg swelling.  Patient would like better control of her hypertension.  The following portions of the patient's history were reviewed and updated as appropriate: allergies, current medications, past family history, past medical history, past social history, past surgical history, and problem list.  Sore Throat    Patient is in today for sore throat  ROS      Objective:    BP (!) 168/97   Pulse (!) 101   Temp (!) 97.5 F (36.4 C)   Ht 5\' 6"  (1.676 m)   Wt 230 lb (104.3 kg)   SpO2 97%   BMI 37.12 kg/m  BP Readings from Last 3 Encounters:  11/15/21 (!) 168/97  07/06/19 (!) 167/102  04/28/19 (!) 154/97      Physical Exam Vitals and nursing note reviewed.  HENT:     Mouth/Throat:     Mouth: Mucous membranes are moist. No oral lesions.     Pharynx: Posterior oropharyngeal erythema present. No pharyngeal swelling or oropharyngeal exudate.     Tonsils: No tonsillar exudate or tonsillar abscesses.  Neck:     Thyroid: No thyromegaly.  Cardiovascular:     Rate and Rhythm:  Normal rate and regular rhythm.     Heart sounds: No murmur heard. Pulmonary:     Effort: Pulmonary effort is normal. No respiratory distress.     Breath sounds: Normal breath sounds.  Musculoskeletal:     Cervical back: Normal range of motion and neck supple.  Lymphadenopathy:     Cervical: No cervical adenopathy.  Skin:    General: Skin is warm.     Capillary Refill: Capillary refill takes less than 2 seconds.     Findings: No rash.  Neurological:     General: No focal deficit present.     Mental Status: She is oriented to person, place, and time.     No results found for any visits on 11/15/21.      Assessment & Plan:   Problem List Items Addressed This Visit   None Visit Diagnoses     Laryngitis, acute    -  Primary   Relevant Medications   predniSONE (DELTASONE) 20 MG tablet   benzonatate (TESSALON) 100 MG capsule   dextromethorphan-guaiFENesin (MUCINEX DM) 30-600 MG 12hr tablet   Uncontrolled stage 2 hypertension           Meds ordered this encounter  Medications   predniSONE (DELTASONE) 20 MG tablet    Sig: Take 3 tablets (60 mg total) by mouth daily  with breakfast for 3 days.    Dispense:  9 tablet    Refill:  0   benzonatate (TESSALON) 100 MG capsule    Sig: Take 1-2 capsules (100-200 mg total) by mouth 2 (two) times daily as needed for cough.    Dispense:  20 capsule    Refill:  0   dextromethorphan-guaiFENesin (MUCINEX DM) 30-600 MG 12hr tablet    Sig: Take 1 tablet by mouth 2 (two) times daily for 10 days.    Dispense:  20 tablet    Refill:  0   Patient has not had a work-up for the source of her hypertension.  Patient will return within the next 30 days or so for lab work and we will order additional tests as necessary to see if there is a reversible cause.  I reviewed the 3 most recent blood pressure recordings in epic.  Patient states normally her systolic runs around 170.  Based on our conversation I am concerned the patient is starting to have  the beginning manifestations of organ damage from longstanding hypertension that is uncontrolled.  Return if symptoms worsen or fail to improve. - laryngitis   Haydee Salter, MD

## 2021-12-13 ENCOUNTER — Ambulatory Visit (HOSPITAL_BASED_OUTPATIENT_CLINIC_OR_DEPARTMENT_OTHER)
Admission: RE | Admit: 2021-12-13 | Discharge: 2021-12-13 | Disposition: A | Discharge status: Home / Self Care | Payer: 59 | Source: Ambulatory Visit | Attending: Family Medicine | Admitting: Family Medicine

## 2021-12-13 ENCOUNTER — Encounter: Payer: Self-pay | Admitting: Family Medicine

## 2021-12-13 ENCOUNTER — Encounter (HOSPITAL_BASED_OUTPATIENT_CLINIC_OR_DEPARTMENT_OTHER): Payer: Self-pay

## 2021-12-13 ENCOUNTER — Ambulatory Visit: Payer: 59 | Admitting: Family Medicine

## 2021-12-13 VITALS — BP 187/113 | HR 69 | Temp 97.0°F | Ht 66.0 in | Wt 230.0 lb

## 2021-12-13 DIAGNOSIS — N12 Tubulo-interstitial nephritis, not specified as acute or chronic: Secondary | ICD-10-CM | POA: Diagnosis present

## 2021-12-13 DIAGNOSIS — R1031 Right lower quadrant pain: Secondary | ICD-10-CM

## 2021-12-13 DIAGNOSIS — R112 Nausea with vomiting, unspecified: Secondary | ICD-10-CM

## 2021-12-13 LAB — POCT URINALYSIS DIPSTICK
Bilirubin, UA: NEGATIVE
Glucose, UA: NEGATIVE
Ketones, UA: NEGATIVE
Nitrite, UA: NEGATIVE
Protein, UA: NEGATIVE
Urobilinogen, UA: 1 E.U./dL
pH, UA: 6 (ref 5.0–8.0)

## 2021-12-13 MED ORDER — DEXTROSE 5 % IV SOLN: 1.0000 g | Freq: Once | INTRAVENOUS | Status: AC

## 2021-12-13 MED ORDER — KETOROLAC TROMETHAMINE 10 MG PO TABS: 10.0000 mg | ORAL_TABLET | Freq: Four times a day (QID) | ORAL | 0 refills | Status: AC | PRN

## 2021-12-13 MED ORDER — SODIUM CHLORIDE 0.9 % IV BOLUS: 500.0000 mL | Freq: Once | INTRAVENOUS | Status: AC

## 2021-12-13 MED ORDER — IOHEXOL 300 MG/ML  SOLN
100.0000 mL | Freq: Once | INTRAMUSCULAR | Status: AC | PRN
  Administered 2021-12-13: 100 mL via INTRAVENOUS

## 2021-12-13 MED ORDER — ONDANSETRON HCL 4 MG PO TABS: 4.0000 mg | ORAL_TABLET | Freq: Three times a day (TID) | ORAL | 0 refills | Status: AC | PRN

## 2021-12-13 MED ORDER — KETOROLAC TROMETHAMINE 60 MG/2ML IM SOLN: 30.0000 mg | Freq: Once | INTRAMUSCULAR | Status: AC

## 2021-12-13 MED ORDER — ONDANSETRON HCL 4 MG/2ML IJ SOLN: 4.0000 mg | Freq: Once | INTRAMUSCULAR | Status: AC

## 2021-12-13 MED ORDER — AMOXICILLIN-POT CLAVULANATE 875-125 MG PO TABS: 1.0000 | ORAL_TABLET | Freq: Two times a day (BID) | ORAL | 0 refills | Status: AC

## 2021-12-13 NOTE — Progress Notes (Signed)
New Patient Office Visit  Subjective    Patient ID: Holly Hays, female    DOB: 09/11/1965  Age: 56 y.o. MRN: [id number]  CC:  Chief Complaint  Patient presents with   Abdominal Pain    Pt. C/O N/V and Right side pain X1 day.    HPI Holly Hays presents to establish care 56 year old female past medical history significant for diabetes and hypertension presents to clinic for an acute care visit regarding nausea vomiting and right abdominal pain.  Patient states she has had discomfort for close to 24 hours.  Patient has not taken any medication for the pain.  Patient cannot tolerate sips of water without vomiting.  Patient complains of 10 out of 10 right flank and right lower quadrant pain.  The pain has not radiated.  Patient denies diarrhea.  Patient states he has been constipated for the last week but has continued to pass gas.  Denies fevers and chills.  Some frequent small-volume micturition.  Patient has no history of abdominal surgery.  Patient states nothing like this is happened before.  Patient has no family history of appendicitis or gallbladder disease.  Outpatient Encounter Medications as of 12/13/2021  Medication Sig   ketorolac (TORADOL) 10 MG tablet Take 1 tablet (10 mg total) by mouth every 6 (six) hours as needed.   ondansetron (ZOFRAN) 4 MG tablet Take 1 tablet (4 mg total) by mouth every 8 (eight) hours as needed for nausea or vomiting.   acetaminophen-codeine (TYLENOL #3) 300-30 MG tablet Take 1 tablet by mouth 2 (two) times daily as needed.   amLODipine (NORVASC) 5 MG tablet Take by mouth.   amLODipine (NORVASC) 5 MG tablet Take 1 tablet (5 mg total) by mouth daily.   baclofen (LIORESAL) 10 MG tablet SMARTSIG:1 Tablet(s) By Mouth Every 12 Hours PRN   benzonatate (TESSALON) 100 MG capsule Take 1-2 capsules (100-200 mg total) by mouth 2 (two) times daily as needed for cough.   famotidine (PEPCID) 20 MG tablet Take by mouth.   famotidine (PEPCID) 20 MG tablet Take  1 tablet (20 mg total) by mouth 2 (two) times daily.   hydrOXYzine (ATARAX/VISTARIL) 25 MG tablet Take 0.5-1 tablets (12.5-25 mg total) by mouth at bedtime as needed for itching.   levothyroxine (SYNTHROID) 50 MCG tablet Take by mouth.   levothyroxine (SYNTHROID) 50 MCG tablet Take 1 tablet (50 mcg total) by mouth daily.   lisinopril-hydrochlorothiazide (ZESTORETIC) 20-12.5 MG tablet Take 2 tablets by mouth daily.   lisinopril-hydrochlorothiazide (ZESTORETIC) 20-12.5 MG tablet Take 2 tablets by mouth daily.   metoprolol (TOPROL XL) 200 MG 24 hr tablet Take 1 tablet (200 mg total) by mouth daily.   metoprolol (TOPROL-XL) 200 MG 24 hr tablet Take 1 tablet (200 mg total) by mouth daily.   metoprolol (TOPROL-XL) 200 MG 24 hr tablet Take by mouth.   metroNIDAZOLE (FLAGYL) 500 MG tablet    triamcinolone cream (KENALOG) 0.1 % Apply topically.   triamcinolone cream (KENALOG) 0.1 % Apply 1 application topically 2 (two) times daily.   valACYclovir (VALTREX) 500 MG tablet TAKE 1 TABLET BY MOUTH ONCE DAILY   valACYclovir (VALTREX) 500 MG tablet Take 1 tablet by mouth once daily (Patient not taking: Reported on 07/06/2019)   valACYclovir (VALTREX) 500 MG tablet Take 1 tablet (500 mg total) by mouth daily. (Patient not taking: Reported on 07/06/2019)   Facility-Administered Encounter Medications as of 12/13/2021  Medication   ketorolac (TORADOL) injection 30 mg   ondansetron (ZOFRAN) injection  4 mg    Past Medical History:  Diagnosis Date   Allergy    Hypertension    Thyroid disease     Past Surgical History:  Procedure Laterality Date   breast reduction      Family History  Problem Relation Age of Onset   Diabetes Mother    Stroke Father    Diabetes Sister     Social History   Socioeconomic History   Marital status: Married    Spouse name: Not on file   Number of children: Not on file   Years of education: Not on file   Highest education level: Not on file  Occupational History   Not  on file  Tobacco Use   Smoking status: Never   Smokeless tobacco: Never  Substance and Sexual Activity   Alcohol use: No   Drug use: No   Sexual activity: Yes  Other Topics Concern   Not on file  Social History Narrative   Married   Education: Secretary/administrator   Exercise: Walking   Social Determinants of Health   Financial Resource Strain: Not on file  Food Insecurity: Not on file  Transportation Needs: Not on file  Physical Activity: Not on file  Stress: Not on file  Social Connections: Not on file  Intimate Partner Violence: Not on file    Review of Systems  Constitutional:  Negative for chills and fever.  Respiratory:  Negative for shortness of breath.   Cardiovascular:  Negative for chest pain.  Gastrointestinal:  Positive for abdominal pain, nausea and vomiting.  Genitourinary:  Positive for flank pain and frequency (small amounts). Negative for dysuria and hematuria.        Objective    BP (!) 187/113   Pulse 69   Temp (!) 97 F (36.1 C)   Ht 5\' 6"  (1.676 m)   Wt 230 lb (104.3 kg)   SpO2 98%   BMI 37.12 kg/m   Physical Exam Vitals and nursing note reviewed.  Constitutional:      General: She is not in acute distress.    Appearance: Normal appearance. She is obese. She is ill-appearing.  HENT:     Head: Normocephalic and atraumatic.     Mouth/Throat:     Mouth: Mucous membranes are moist.     Pharynx: Oropharynx is clear.  Eyes:     Extraocular Movements: Extraocular movements intact.     Pupils: Pupils are equal, round, and reactive to light.  Cardiovascular:     Rate and Rhythm: Normal rate and regular rhythm.     Heart sounds: No murmur heard.    No friction rub. No gallop.  Pulmonary:     Effort: Pulmonary effort is normal.     Breath sounds: Normal breath sounds.  Abdominal:     General: Abdomen is flat. There is no distension. There are no signs of injury.     Palpations: Abdomen is soft.     Tenderness: There is abdominal tenderness in the  right lower quadrant. There is right CVA tenderness. There is no left CVA tenderness, guarding or rebound. Negative signs include Rovsing's sign and psoas sign.     Hernia: No hernia is present.  Skin:    General: Skin is warm.     Capillary Refill: Capillary refill takes less than 2 seconds.     Findings: No rash.  Neurological:     General: No focal deficit present.     Mental Status: She is alert and oriented  to person, place, and time.     Motor: No weakness.  Psychiatric:        Mood and Affect: Mood is anxious.        Behavior: Behavior normal.         Assessment & Plan:   Problem List Items Addressed This Visit   None Visit Diagnoses     Pyelonephritis    -  Primary   Relevant Medications   ketorolac (TORADOL) injection 30 mg   ondansetron (ZOFRAN) 4 MG tablet   ketorolac (TORADOL) 10 MG tablet   amoxicillin-clavulanate (AUGMENTIN) 875-125 MG tablet   Other Relevant Orders   Urine Culture   Comprehensive Metabolic Panel (CMET)   CBC with Differential   CT Abdomen Pelvis W Contrast   Nausea and vomiting, unspecified vomiting type       Relevant Medications   ondansetron (ZOFRAN) injection 4 mg   Other Relevant Orders   POCT urinalysis dipstick (Completed)   Comprehensive Metabolic Panel (CMET)   Right lower quadrant abdominal pain       Relevant Medications   ketorolac (TORADOL) injection 30 mg   ondansetron (ZOFRAN) 4 MG tablet   ketorolac (TORADOL) 10 MG tablet   Other Relevant Orders   Comprehensive Metabolic Panel (CMET)   CT Abdomen Pelvis W Contrast      -Patient came to the office complaining of nausea vomiting abdominal pain and was given Zofran this controlled the nausea vomiting.  Initially she complained of severe abdominal pain but this resolved mostly on its own.  Additionally patient was given Toradol which she stated also helped.  Patient was pain-free by the time she left the office.  Urine was checked and has significant leukocytes.  Between  that and patient's right CVA tenderness highly suspect patient has pyelonephritis.  Patient does have some right lower quadrant tenderness.  I feel that patient needs to be ruled out for possible appendicitis and kidney stone which are also on the differential.  I explained this to the patient she voiced understanding.  Note, the patient was told multiple times to go to the emergency room to get a CT scan and evaluation.  She adamantly refused multiple times.  She was given IV fluids here in the office along with a gram of Rocephin IV.  Medications as above sent to her pharmacy.  We have called ahead to the local imaging facility and patient has a scan scheduled for 130 today.  We will follow-up on this.  Patient voiced understanding of all precautions, advice and all questions were answered.  3+Leuck on UA  Additional, I perfromed serial exams on the patient and she did improve greatly compared to when she first came through the door.  Blood pressure also improved.  Return in about 2 weeks (around 12/27/2021) for Pyelonephritis.   Haydee Salter, MD

## 2021-12-13 NOTE — Progress Notes (Signed)
Per Dr. Melynda Ripple patient was given Zofran 4mg  left deltoid. TM Patient was offered IV fluids and declined. TM

## 2021-12-14 ENCOUNTER — Other Ambulatory Visit: Payer: Self-pay

## 2021-12-14 DIAGNOSIS — R935 Abnormal findings on diagnostic imaging of other abdominal regions, including retroperitoneum: Secondary | ICD-10-CM

## 2021-12-14 DIAGNOSIS — R1011 Right upper quadrant pain: Secondary | ICD-10-CM

## 2021-12-14 LAB — COMPREHENSIVE METABOLIC PANEL
ALT: 15 IU/L (ref 0–32)
AST: 16 IU/L (ref 0–40)
Albumin/Globulin Ratio: 1.2 (ref 1.2–2.2)
Albumin: 3.8 g/dL (ref 3.8–4.9)
Alkaline Phosphatase: 61 IU/L (ref 44–121)
BUN/Creatinine Ratio: 15 (ref 9–23)
BUN: 11 mg/dL (ref 6–24)
Bilirubin Total: 0.3 mg/dL (ref 0.0–1.2)
CO2: 22 mmol/L (ref 20–29)
Calcium: 9 mg/dL (ref 8.7–10.2)
Chloride: 107 mmol/L — ABNORMAL HIGH (ref 96–106)
Creatinine, Ser: 0.75 mg/dL (ref 0.57–1.00)
Globulin, Total: 3.1 g/dL (ref 1.5–4.5)
Glucose: 95 mg/dL (ref 70–99)
Potassium: 4.9 mmol/L (ref 3.5–5.2)
Sodium: 140 mmol/L (ref 134–144)
Total Protein: 6.9 g/dL (ref 6.0–8.5)
eGFR: 95 mL/min/{1.73_m2} (ref >59)

## 2021-12-14 LAB — CBC WITH DIFFERENTIAL/PLATELET
Basophils Absolute: 0 10*3/uL (ref 0.0–0.2)
Basos: 1 %
EOS (ABSOLUTE): 0.2 10*3/uL (ref 0.0–0.4)
Eos: 3 %
Hematocrit: 35.5 % (ref 34.0–46.6)
Hemoglobin: 12 g/dL (ref 11.1–15.9)
Immature Grans (Abs): 0 10*3/uL (ref 0.0–0.1)
Immature Granulocytes: 1 %
Lymphocytes Absolute: 2.3 10*3/uL (ref 0.7–3.1)
Lymphs: 35 %
MCH: 34.7 pg — ABNORMAL HIGH (ref 26.6–33.0)
MCHC: 33.8 g/dL (ref 31.5–35.7)
MCV: 103 fL — ABNORMAL HIGH (ref 79–97)
Monocytes Absolute: 1.1 10*3/uL — ABNORMAL HIGH (ref 0.1–0.9)
Monocytes: 17 %
Neutrophils Absolute: 2.9 10*3/uL (ref 1.4–7.0)
Neutrophils: 43 %
Platelets: 221 10*3/uL (ref 150–450)
RBC: 3.46 x10E6/uL — ABNORMAL LOW (ref 3.77–5.28)
RDW: 11.8 % (ref 11.7–15.4)
WBC: 6.6 10*3/uL (ref 3.4–10.8)

## 2021-12-15 ENCOUNTER — Encounter: Payer: Self-pay | Admitting: Family Medicine

## 2021-12-15 ENCOUNTER — Ambulatory Visit: Payer: 59 | Admitting: Family Medicine

## 2021-12-15 LAB — URINE CULTURE

## 2021-12-27 ENCOUNTER — Telehealth: Payer: Self-pay

## 2021-12-27 ENCOUNTER — Ambulatory Visit: Payer: 59 | Admitting: Family Medicine

## 2021-12-27 NOTE — Telephone Encounter (Signed)
12/27/21 Unable to reach pt. Phone rings and then gives busy signal. TM

## 2022-06-28 NOTE — Progress Notes (Signed)
This encounter was created in error - please disregard.

## 2022-09-05 IMAGING — US US THYROID
1 series · 12 of 25 positions shown · non-contrast
Comparison: 08/20/2017 and previous back to 08/25/2012

CLINICAL DATA: Goiter

EXAM:
THYROID ULTRASOUND
TECHNIQUE: Ultrasound examination of the thyroid gland and adjacent soft
tissues was performed.

[Series 1: us thyroid · 0.07mm/px · 12 of 53 slices shown]
[im 3/53]
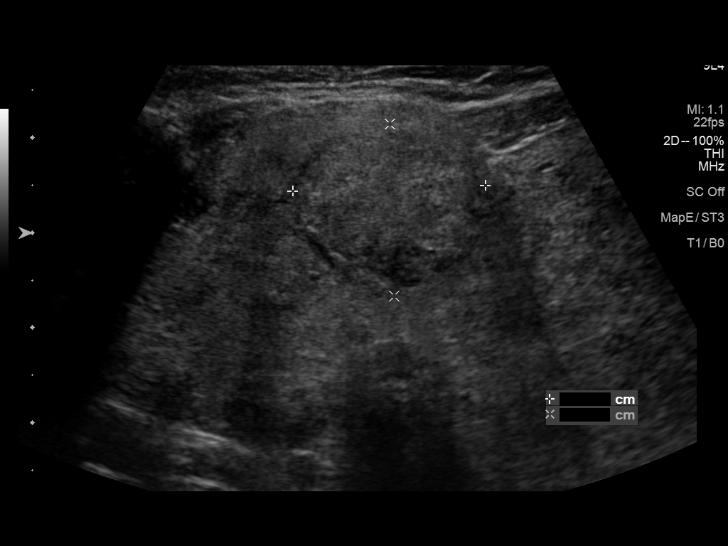
[im 7/53]
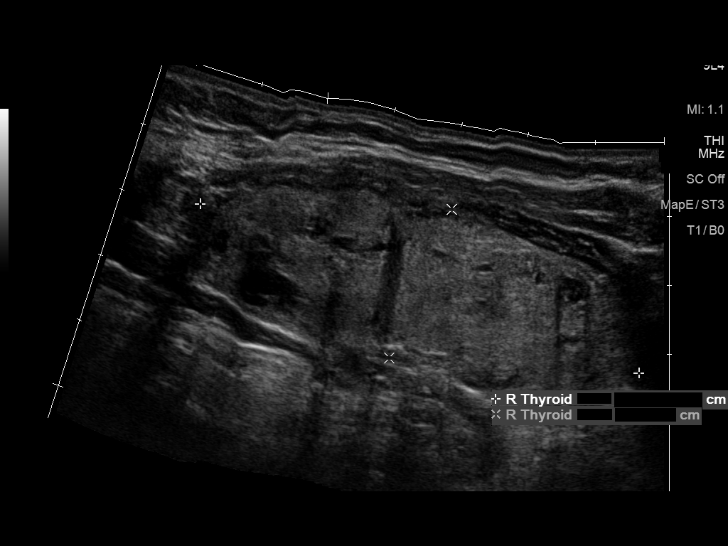
[im 11/53]
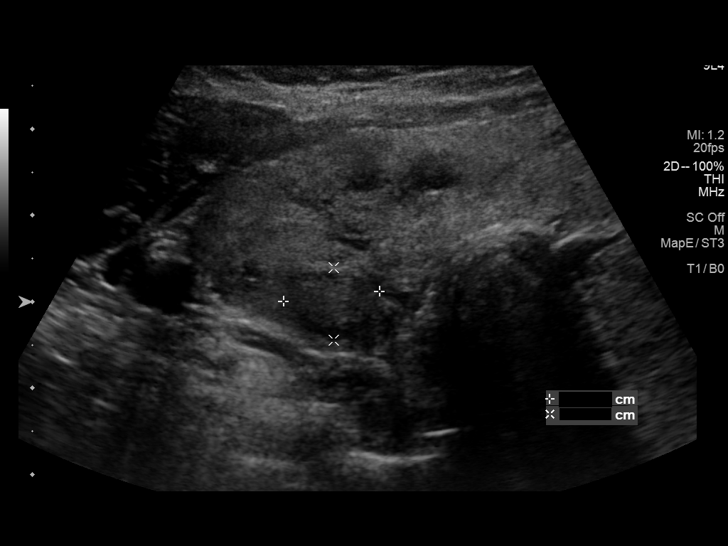
[im 16/53]
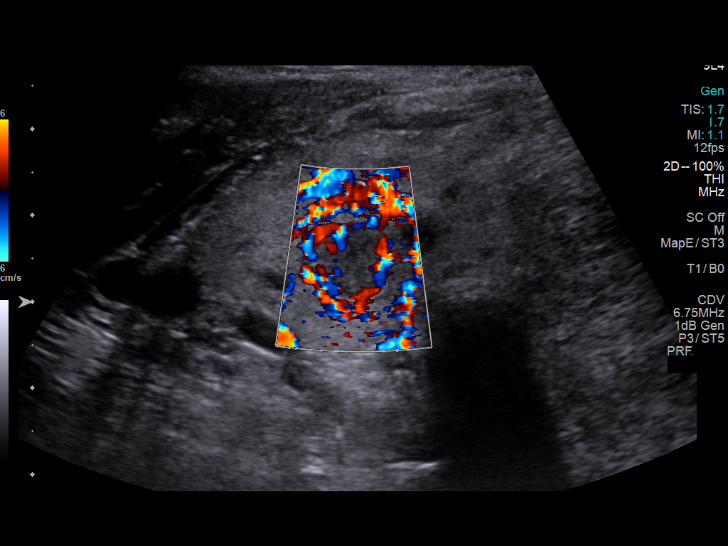
[im 20/53]
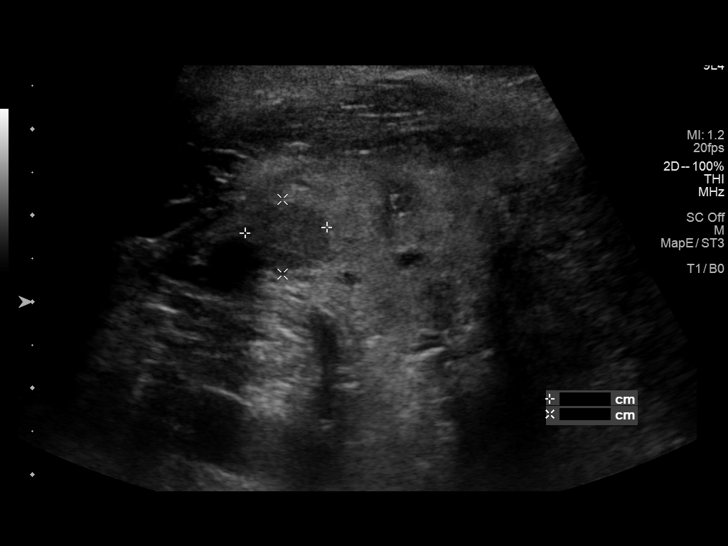
[im 24/53]
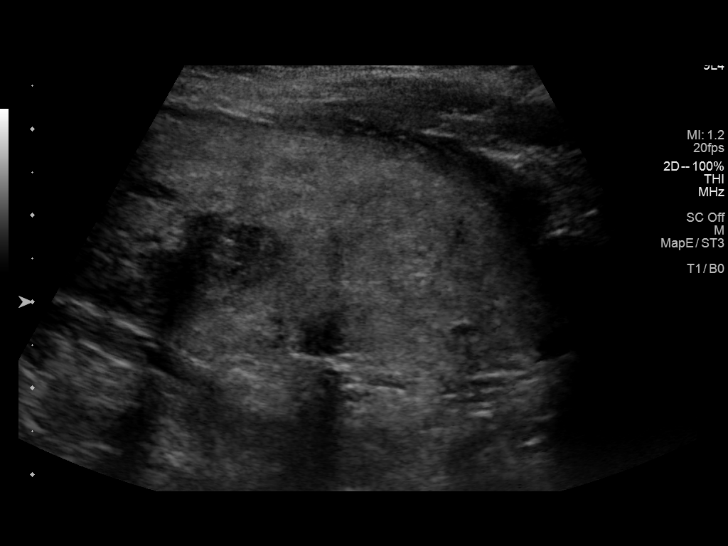
[im 29/53]
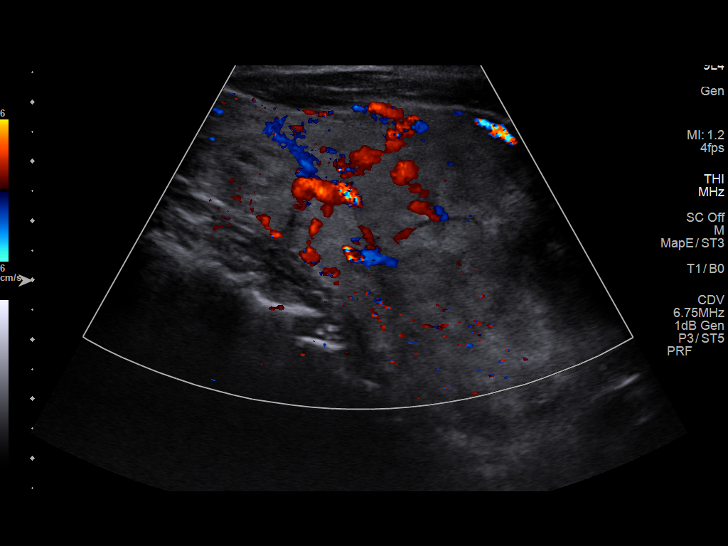
[im 33/53]
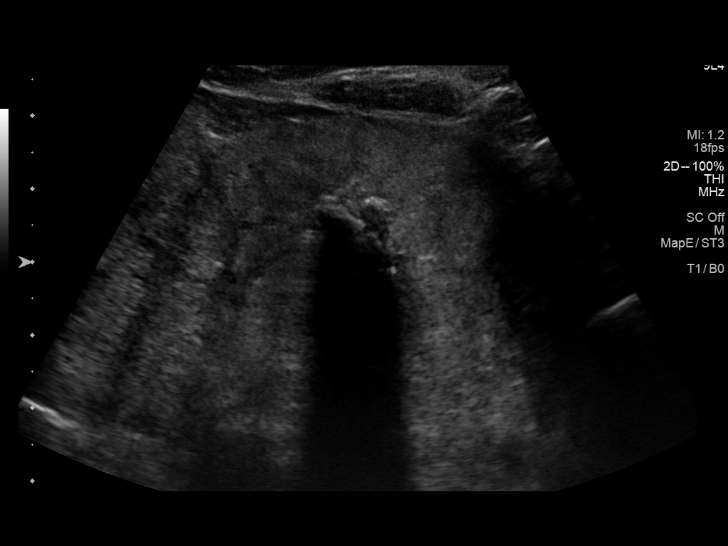
[im 37/53]
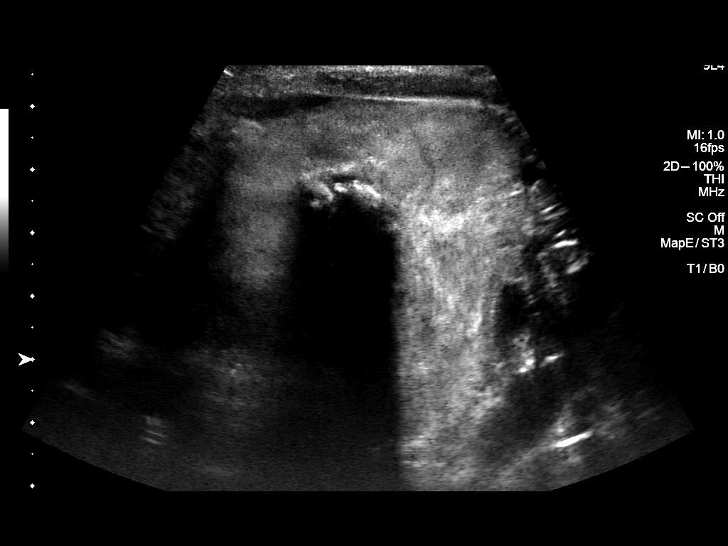
[im 42/53]
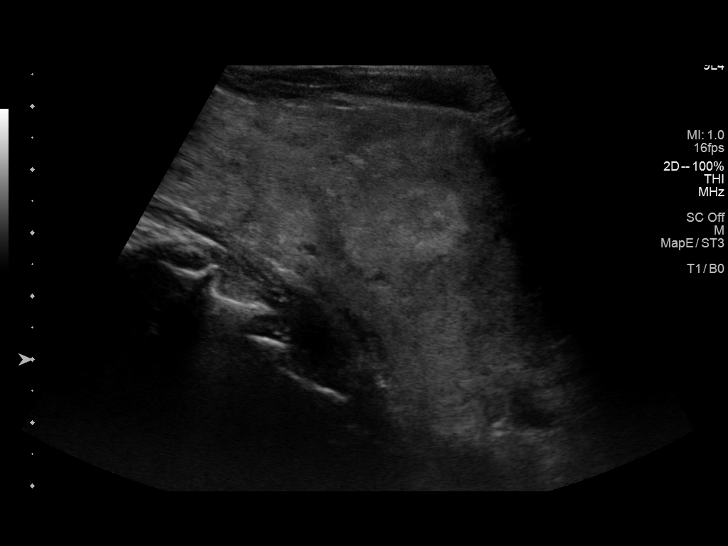
[im 46/53]
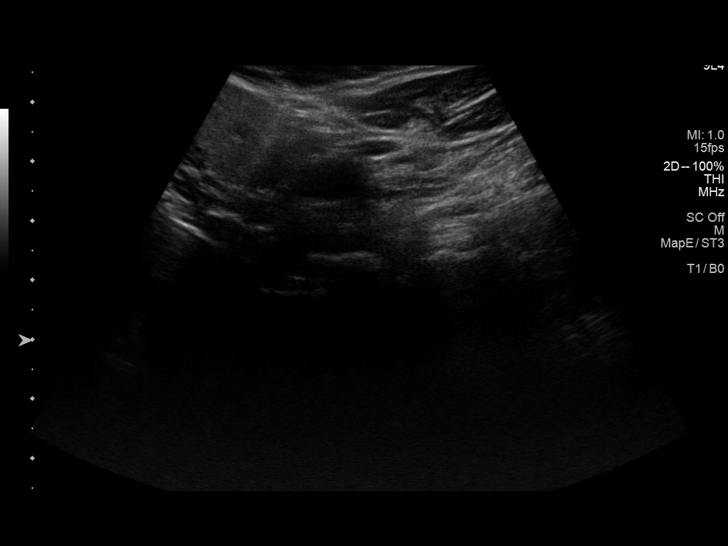
[im 50/53]
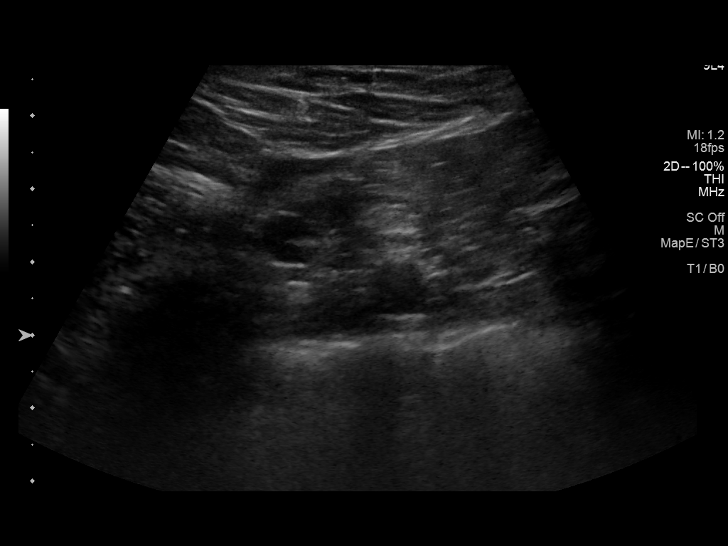

[12 of 25 positions shown; findings below may reference images not displayed]

FINDINGS: Parenchymal Echotexture: Moderately heterogenous

Isthmus: 2.5 cm thickness, previously

Right lobe: 6.9 x 2.4 x 3.1 cm, previously 7.3 x 2.7 x

Left lobe: 10.9 x 3.8 x 4.4 cm, previously 10.7 x 4.4 x

_________________________________________________________

Estimated total number of nodules >/= 1 cm: 5

Number of spongiform nodules >/=  2 cm not described below (TR1): 0

Number of mixed cystic and solid nodules >/= 1.5 cm not described
below (TR2): 0

_________________________________________________________

Nodule # 1:

Location: Isthmus;

Maximum size: 2.1 cm; Other 2 dimensions: 2 x 1.8 cm

Composition: solid/almost completely solid (2)

Echogenicity: isoechoic (1)

Shape: not taller-than-wide (0)

Margins: ill-defined (0)

Echogenic foci: none (0)

ACR TI-RADS total points: 3.

ACR TI-RADS risk category: TR3 (3 points).

ACR TI-RADS recommendations:

*Given size (>/= 1.5 - 2.4 cm) and appearance, a follow-up
ultrasound in 1 year should be considered based on TI-RADS criteria.

_________________________________________________________

Nodule # 2:

Prior biopsy: No

Location: Right; Superior posterior

Maximum size: 1.2 cm; Other 2 dimensions: 1.1 x 0.8 cm, previously,
1.4 x 1.4 x 0.8 cm

Composition: solid/almost completely solid (2)

Echogenicity: hypoechoic (2)

Shape: not taller-than-wide (0)

Margins: ill-defined (0)

Echogenic foci: none (0)

ACR TI-RADS total points: 4.

ACR TI-RADS risk category:  TR4 (4-6 points).

Significant change in size (>/= 20% in two dimensions and minimal
increase of 2 mm): No

Change in features: No

Change in ACR TI-RADS risk category: No

ACR TI-RADS recommendations:

*Given size (>/= 1 - 1.4 cm) and appearance, a follow-up ultrasound
in 1 year should be considered based on TI-RADS criteria.

_________________________________________________________

Nodule # 3: 1.3 cm hypoechoic nodule without calcifications, mid
right; *Given size (>/= 1 - 1.4 cm) and appearance, a follow-up
ultrasound in 1 year should be considered based on TI-RADS criteria.

Nodule # 4: 0.8 cm hypoechoic nodular calcifications, inferior
right; This nodule does NOT meet TI-RADS criteria for biopsy or
dedicated follow-up.

Nodule # 5: 1.4 cm hypoechoic nodule without calcifications,
inferior right; *Given size (>/= 1 - 1.4 cm) and appearance, a
follow-up ultrasound in 1 year should be considered based on TI-RADS
criteria.

Nodule # 6:

Prior biopsy: No

Location: Left; Inferior

Maximum size: 4.4 cm; Other 2 dimensions: 4.1 x 3.9 cm, previously,
5.1 x 4.7 x 3.5 cm on 08/25/2012

Composition: solid/almost completely solid (2)

Echogenicity: hypoechoic (2)

Shape: not taller-than-wide (0)

Margins: ill-defined (0)

Echogenic foci: none (0)

ACR TI-RADS total points: 4.

ACR TI-RADS risk category:  TR4 (4-6 points).

Significant change in size (>/= 20% in two dimensions and minimal
increase of 2 mm): No

Change in features: No

Change in ACR TI-RADS risk category: No

ACR TI-RADS recommendations:

Stability for greater than 5 years implies benignity; This nodule
does NOT meet TI-RADS criteria for biopsy or dedicated follow-up.
IMPRESSION: 1. Thyromegaly with bilateral nodules. None meets criteria for
biopsy.
2. Recommend annual/biennial ultrasound follow-up of right nodule as
above, until stability x5 years confirmed.

The above is in keeping with the ACR TI-RADS recommendations - [HOSPITAL] 3969;[DATE].

## 2022-09-27 ENCOUNTER — Other Ambulatory Visit: Payer: Self-pay | Admitting: Family Medicine

## 2022-09-27 DIAGNOSIS — E049 Nontoxic goiter, unspecified: Secondary | ICD-10-CM
